# Patient Record
Sex: Female | Born: 1985 | Race: White | Hispanic: No | Marital: Single | Smoking: Never smoker
Health system: Southern US, Community
[De-identification: ages and names within clinical notes are randomized; demographics above are authoritative.]

## PROBLEM LIST (undated history)

## (undated) DIAGNOSIS — F419 Anxiety disorder, unspecified: Secondary | ICD-10-CM

## (undated) DIAGNOSIS — M797 Fibromyalgia: Secondary | ICD-10-CM

## (undated) HISTORY — DX: Anxiety disorder, unspecified: F41.9

## (undated) HISTORY — DX: Fibromyalgia: M79.7

## (undated) HISTORY — PX: KNEE ARTHROSCOPY: SHX127

---

## 2009-05-30 ENCOUNTER — Emergency Department (HOSPITAL_COMMUNITY): Admission: EM | Admit: 2009-05-30 | Discharge: 2009-05-30 | Payer: Self-pay | Admitting: Emergency Medicine

## 2010-03-16 ENCOUNTER — Encounter: Admission: RE | Admit: 2010-03-16 | Discharge: 2010-03-16 | Payer: Self-pay | Admitting: Emergency Medicine

## 2010-10-16 ENCOUNTER — Ambulatory Visit: Payer: BC Managed Care – PPO | Attending: Family Medicine | Admitting: Physical Therapy

## 2010-10-16 DIAGNOSIS — M25559 Pain in unspecified hip: Secondary | ICD-10-CM | POA: Insufficient documentation

## 2010-10-16 DIAGNOSIS — M6281 Muscle weakness (generalized): Secondary | ICD-10-CM | POA: Insufficient documentation

## 2010-10-16 DIAGNOSIS — R293 Abnormal posture: Secondary | ICD-10-CM | POA: Insufficient documentation

## 2010-10-16 DIAGNOSIS — IMO0001 Reserved for inherently not codable concepts without codable children: Secondary | ICD-10-CM | POA: Insufficient documentation

## 2010-10-23 ENCOUNTER — Ambulatory Visit: Payer: BC Managed Care – PPO | Admitting: Physical Therapy

## 2010-10-25 ENCOUNTER — Ambulatory Visit: Payer: BC Managed Care – PPO | Attending: Family Medicine | Admitting: Physical Therapy

## 2010-10-25 DIAGNOSIS — IMO0001 Reserved for inherently not codable concepts without codable children: Secondary | ICD-10-CM | POA: Insufficient documentation

## 2010-10-25 DIAGNOSIS — M6281 Muscle weakness (generalized): Secondary | ICD-10-CM | POA: Insufficient documentation

## 2010-10-25 DIAGNOSIS — M25559 Pain in unspecified hip: Secondary | ICD-10-CM | POA: Insufficient documentation

## 2010-10-25 DIAGNOSIS — R293 Abnormal posture: Secondary | ICD-10-CM | POA: Insufficient documentation

## 2010-10-29 ENCOUNTER — Ambulatory Visit: Payer: BC Managed Care – PPO

## 2010-11-01 ENCOUNTER — Ambulatory Visit: Payer: BC Managed Care – PPO | Admitting: Rehabilitation

## 2010-11-05 ENCOUNTER — Ambulatory Visit: Payer: BC Managed Care – PPO | Admitting: Rehabilitation

## 2010-11-08 ENCOUNTER — Ambulatory Visit: Payer: BC Managed Care – PPO | Admitting: Physical Therapy

## 2010-11-12 ENCOUNTER — Ambulatory Visit: Payer: BC Managed Care – PPO

## 2010-11-15 ENCOUNTER — Encounter: Payer: BC Managed Care – PPO | Admitting: Physical Therapy

## 2010-11-20 ENCOUNTER — Ambulatory Visit: Payer: BC Managed Care – PPO | Admitting: Physical Therapy

## 2010-11-22 ENCOUNTER — Ambulatory Visit: Payer: BC Managed Care – PPO | Admitting: Physical Therapy

## 2010-11-26 ENCOUNTER — Ambulatory Visit: Payer: BC Managed Care – PPO | Attending: Family Medicine | Admitting: Physical Therapy

## 2010-11-26 DIAGNOSIS — M6281 Muscle weakness (generalized): Secondary | ICD-10-CM | POA: Insufficient documentation

## 2010-11-26 DIAGNOSIS — IMO0001 Reserved for inherently not codable concepts without codable children: Secondary | ICD-10-CM | POA: Insufficient documentation

## 2010-11-26 DIAGNOSIS — M25559 Pain in unspecified hip: Secondary | ICD-10-CM | POA: Insufficient documentation

## 2010-11-26 DIAGNOSIS — R293 Abnormal posture: Secondary | ICD-10-CM | POA: Insufficient documentation

## 2010-11-28 ENCOUNTER — Ambulatory Visit: Payer: BC Managed Care – PPO | Admitting: Physical Therapy

## 2010-12-04 ENCOUNTER — Ambulatory Visit: Payer: BC Managed Care – PPO | Admitting: Physical Therapy

## 2010-12-10 ENCOUNTER — Ambulatory Visit: Payer: BC Managed Care – PPO | Admitting: Physical Therapy

## 2010-12-12 ENCOUNTER — Ambulatory Visit: Payer: BC Managed Care – PPO | Admitting: Physical Therapy

## 2010-12-17 ENCOUNTER — Ambulatory Visit: Payer: BC Managed Care – PPO | Admitting: Physical Therapy

## 2010-12-19 ENCOUNTER — Ambulatory Visit: Payer: BC Managed Care – PPO | Admitting: Rehabilitation

## 2010-12-24 ENCOUNTER — Ambulatory Visit: Payer: BC Managed Care – PPO | Admitting: Physical Therapy

## 2010-12-26 ENCOUNTER — Ambulatory Visit: Payer: BC Managed Care – PPO | Attending: Family Medicine | Admitting: Physical Therapy

## 2010-12-26 DIAGNOSIS — M6281 Muscle weakness (generalized): Secondary | ICD-10-CM | POA: Insufficient documentation

## 2010-12-26 DIAGNOSIS — M25559 Pain in unspecified hip: Secondary | ICD-10-CM | POA: Insufficient documentation

## 2010-12-26 DIAGNOSIS — IMO0001 Reserved for inherently not codable concepts without codable children: Secondary | ICD-10-CM | POA: Insufficient documentation

## 2010-12-26 DIAGNOSIS — R293 Abnormal posture: Secondary | ICD-10-CM | POA: Insufficient documentation

## 2010-12-31 ENCOUNTER — Ambulatory Visit: Payer: BC Managed Care – PPO | Admitting: Physical Therapy

## 2011-01-03 ENCOUNTER — Ambulatory Visit: Payer: BC Managed Care – PPO | Admitting: Rehabilitation

## 2011-01-07 ENCOUNTER — Ambulatory Visit: Payer: BC Managed Care – PPO | Admitting: Physical Therapy

## 2011-01-09 ENCOUNTER — Ambulatory Visit: Payer: BC Managed Care – PPO | Admitting: Physical Therapy

## 2011-01-15 ENCOUNTER — Ambulatory Visit: Payer: BC Managed Care – PPO | Admitting: Physical Therapy

## 2011-01-17 ENCOUNTER — Ambulatory Visit: Payer: BC Managed Care – PPO | Admitting: Rehabilitation

## 2011-01-22 ENCOUNTER — Ambulatory Visit: Payer: BC Managed Care – PPO | Admitting: Physical Therapy

## 2011-01-24 ENCOUNTER — Ambulatory Visit: Payer: BC Managed Care – PPO | Admitting: Physical Therapy

## 2011-01-28 ENCOUNTER — Encounter: Payer: BC Managed Care – PPO | Admitting: Physical Therapy

## 2011-01-30 ENCOUNTER — Ambulatory Visit: Payer: BC Managed Care – PPO | Attending: Family Medicine | Admitting: Physical Therapy

## 2011-01-30 DIAGNOSIS — R293 Abnormal posture: Secondary | ICD-10-CM | POA: Insufficient documentation

## 2011-01-30 DIAGNOSIS — IMO0001 Reserved for inherently not codable concepts without codable children: Secondary | ICD-10-CM | POA: Insufficient documentation

## 2011-01-30 DIAGNOSIS — M25559 Pain in unspecified hip: Secondary | ICD-10-CM | POA: Insufficient documentation

## 2011-01-30 DIAGNOSIS — M6281 Muscle weakness (generalized): Secondary | ICD-10-CM | POA: Insufficient documentation

## 2011-02-04 ENCOUNTER — Encounter: Payer: BC Managed Care – PPO | Admitting: Physical Therapy

## 2011-02-06 ENCOUNTER — Ambulatory Visit: Payer: BC Managed Care – PPO | Admitting: Physical Therapy

## 2011-02-18 ENCOUNTER — Ambulatory Visit: Payer: BC Managed Care – PPO | Admitting: Physical Therapy

## 2011-02-19 ENCOUNTER — Ambulatory Visit: Payer: BC Managed Care – PPO | Admitting: Physical Therapy

## 2011-02-20 ENCOUNTER — Ambulatory Visit: Payer: BC Managed Care – PPO | Admitting: Physical Therapy

## 2011-02-25 ENCOUNTER — Ambulatory Visit: Payer: BC Managed Care – PPO | Attending: Family Medicine | Admitting: Rehabilitation

## 2011-02-25 DIAGNOSIS — M25559 Pain in unspecified hip: Secondary | ICD-10-CM | POA: Insufficient documentation

## 2011-02-25 DIAGNOSIS — IMO0001 Reserved for inherently not codable concepts without codable children: Secondary | ICD-10-CM | POA: Insufficient documentation

## 2011-02-25 DIAGNOSIS — M6281 Muscle weakness (generalized): Secondary | ICD-10-CM | POA: Insufficient documentation

## 2011-02-25 DIAGNOSIS — R293 Abnormal posture: Secondary | ICD-10-CM | POA: Insufficient documentation

## 2011-03-04 ENCOUNTER — Ambulatory Visit: Payer: BC Managed Care – PPO | Admitting: Rehabilitation

## 2011-03-18 ENCOUNTER — Ambulatory Visit: Payer: BC Managed Care – PPO | Admitting: Physical Therapy

## 2011-03-20 ENCOUNTER — Ambulatory Visit: Payer: BC Managed Care – PPO | Admitting: Physical Therapy

## 2011-03-25 ENCOUNTER — Ambulatory Visit: Payer: BC Managed Care – PPO | Admitting: Physical Therapy

## 2011-03-27 ENCOUNTER — Ambulatory Visit: Payer: BC Managed Care – PPO | Attending: Family Medicine | Admitting: Physical Therapy

## 2011-03-27 DIAGNOSIS — R293 Abnormal posture: Secondary | ICD-10-CM | POA: Insufficient documentation

## 2011-03-27 DIAGNOSIS — M6281 Muscle weakness (generalized): Secondary | ICD-10-CM | POA: Insufficient documentation

## 2011-03-27 DIAGNOSIS — IMO0001 Reserved for inherently not codable concepts without codable children: Secondary | ICD-10-CM | POA: Insufficient documentation

## 2011-03-27 DIAGNOSIS — M25559 Pain in unspecified hip: Secondary | ICD-10-CM | POA: Insufficient documentation

## 2011-04-01 ENCOUNTER — Ambulatory Visit: Payer: BC Managed Care – PPO | Admitting: Physical Therapy

## 2011-04-04 ENCOUNTER — Encounter: Payer: BC Managed Care – PPO | Admitting: Physical Therapy

## 2011-04-08 ENCOUNTER — Ambulatory Visit: Payer: BC Managed Care – PPO | Attending: Sports Medicine | Admitting: Physical Therapy

## 2011-04-08 DIAGNOSIS — IMO0001 Reserved for inherently not codable concepts without codable children: Secondary | ICD-10-CM | POA: Insufficient documentation

## 2011-04-08 DIAGNOSIS — M25569 Pain in unspecified knee: Secondary | ICD-10-CM | POA: Insufficient documentation

## 2011-04-08 DIAGNOSIS — R262 Difficulty in walking, not elsewhere classified: Secondary | ICD-10-CM | POA: Insufficient documentation

## 2011-04-08 DIAGNOSIS — M25669 Stiffness of unspecified knee, not elsewhere classified: Secondary | ICD-10-CM | POA: Insufficient documentation

## 2011-04-11 ENCOUNTER — Ambulatory Visit: Payer: BC Managed Care – PPO | Admitting: Physical Therapy

## 2011-04-16 ENCOUNTER — Ambulatory Visit: Payer: BC Managed Care – PPO | Admitting: Physical Therapy

## 2011-04-17 ENCOUNTER — Ambulatory Visit: Payer: BC Managed Care – PPO | Admitting: Rehabilitation

## 2011-04-22 ENCOUNTER — Ambulatory Visit: Payer: BC Managed Care – PPO | Admitting: Rehabilitation

## 2011-04-24 ENCOUNTER — Ambulatory Visit: Payer: BC Managed Care – PPO | Admitting: Rehabilitation

## 2011-04-30 ENCOUNTER — Ambulatory Visit: Payer: BC Managed Care – PPO | Attending: Sports Medicine | Admitting: Rehabilitation

## 2011-04-30 DIAGNOSIS — R262 Difficulty in walking, not elsewhere classified: Secondary | ICD-10-CM | POA: Insufficient documentation

## 2011-04-30 DIAGNOSIS — IMO0001 Reserved for inherently not codable concepts without codable children: Secondary | ICD-10-CM | POA: Insufficient documentation

## 2011-04-30 DIAGNOSIS — M25669 Stiffness of unspecified knee, not elsewhere classified: Secondary | ICD-10-CM | POA: Insufficient documentation

## 2011-04-30 DIAGNOSIS — M25569 Pain in unspecified knee: Secondary | ICD-10-CM | POA: Insufficient documentation

## 2011-05-02 ENCOUNTER — Ambulatory Visit: Payer: BC Managed Care – PPO | Admitting: Physical Therapy

## 2011-05-06 ENCOUNTER — Ambulatory Visit: Payer: BC Managed Care – PPO | Admitting: Physical Therapy

## 2011-05-08 ENCOUNTER — Ambulatory Visit: Payer: BC Managed Care – PPO | Admitting: Rehabilitation

## 2011-05-15 ENCOUNTER — Ambulatory Visit: Payer: BC Managed Care – PPO | Admitting: Physical Therapy

## 2011-05-22 ENCOUNTER — Ambulatory Visit: Payer: BC Managed Care – PPO | Admitting: Physical Therapy

## 2011-05-23 ENCOUNTER — Ambulatory Visit: Payer: BC Managed Care – PPO | Admitting: Physical Therapy

## 2011-05-29 ENCOUNTER — Ambulatory Visit: Payer: BC Managed Care – PPO | Attending: Family Medicine | Admitting: Physical Therapy

## 2011-05-29 DIAGNOSIS — M25559 Pain in unspecified hip: Secondary | ICD-10-CM | POA: Insufficient documentation

## 2011-05-29 DIAGNOSIS — R293 Abnormal posture: Secondary | ICD-10-CM | POA: Insufficient documentation

## 2011-05-29 DIAGNOSIS — M6281 Muscle weakness (generalized): Secondary | ICD-10-CM | POA: Insufficient documentation

## 2011-05-29 DIAGNOSIS — IMO0001 Reserved for inherently not codable concepts without codable children: Secondary | ICD-10-CM | POA: Insufficient documentation

## 2011-05-30 ENCOUNTER — Ambulatory Visit: Payer: BC Managed Care – PPO | Admitting: Physical Therapy

## 2011-06-03 ENCOUNTER — Ambulatory Visit: Payer: BC Managed Care – PPO | Admitting: Physical Therapy

## 2011-06-05 ENCOUNTER — Ambulatory Visit: Payer: BC Managed Care – PPO | Admitting: Physical Therapy

## 2011-06-10 ENCOUNTER — Ambulatory Visit: Payer: BC Managed Care – PPO | Admitting: Physical Therapy

## 2011-06-12 ENCOUNTER — Ambulatory Visit: Payer: BC Managed Care – PPO | Admitting: Physical Therapy

## 2011-06-17 ENCOUNTER — Ambulatory Visit: Payer: BC Managed Care – PPO | Admitting: Physical Therapy

## 2011-06-19 ENCOUNTER — Ambulatory Visit: Payer: BC Managed Care – PPO | Admitting: Physical Therapy

## 2011-06-24 ENCOUNTER — Ambulatory Visit: Payer: BC Managed Care – PPO | Admitting: Physical Therapy

## 2011-06-26 ENCOUNTER — Ambulatory Visit: Payer: BC Managed Care – PPO | Admitting: Physical Therapy

## 2011-07-04 ENCOUNTER — Ambulatory Visit: Payer: BC Managed Care – PPO | Attending: Family Medicine | Admitting: Physical Therapy

## 2011-07-04 DIAGNOSIS — R293 Abnormal posture: Secondary | ICD-10-CM | POA: Insufficient documentation

## 2011-07-04 DIAGNOSIS — M6281 Muscle weakness (generalized): Secondary | ICD-10-CM | POA: Insufficient documentation

## 2011-07-04 DIAGNOSIS — IMO0001 Reserved for inherently not codable concepts without codable children: Secondary | ICD-10-CM | POA: Insufficient documentation

## 2011-07-04 DIAGNOSIS — M25559 Pain in unspecified hip: Secondary | ICD-10-CM | POA: Insufficient documentation

## 2011-07-08 ENCOUNTER — Ambulatory Visit: Payer: BC Managed Care – PPO | Admitting: Physical Therapy

## 2011-07-10 ENCOUNTER — Encounter: Payer: BC Managed Care – PPO | Admitting: Physical Therapy

## 2011-07-15 ENCOUNTER — Encounter: Payer: BC Managed Care – PPO | Admitting: Physical Therapy

## 2011-07-17 ENCOUNTER — Encounter: Payer: BC Managed Care – PPO | Admitting: Physical Therapy

## 2011-07-22 ENCOUNTER — Ambulatory Visit: Payer: BC Managed Care – PPO | Admitting: Physical Therapy

## 2011-07-22 ENCOUNTER — Encounter: Payer: BC Managed Care – PPO | Admitting: Physical Therapy

## 2011-07-23 ENCOUNTER — Ambulatory Visit: Payer: BC Managed Care – PPO | Admitting: Physical Therapy

## 2011-07-24 ENCOUNTER — Encounter: Payer: BC Managed Care – PPO | Admitting: Physical Therapy

## 2011-07-29 ENCOUNTER — Ambulatory Visit: Payer: BC Managed Care – PPO | Attending: Family Medicine | Admitting: Physical Therapy

## 2011-07-29 DIAGNOSIS — M25559 Pain in unspecified hip: Secondary | ICD-10-CM | POA: Insufficient documentation

## 2011-07-29 DIAGNOSIS — IMO0001 Reserved for inherently not codable concepts without codable children: Secondary | ICD-10-CM | POA: Insufficient documentation

## 2011-07-29 DIAGNOSIS — R293 Abnormal posture: Secondary | ICD-10-CM | POA: Insufficient documentation

## 2011-07-29 DIAGNOSIS — M6281 Muscle weakness (generalized): Secondary | ICD-10-CM | POA: Insufficient documentation

## 2011-07-31 ENCOUNTER — Ambulatory Visit: Payer: BC Managed Care – PPO | Admitting: Physical Therapy

## 2011-08-05 ENCOUNTER — Ambulatory Visit: Payer: BC Managed Care – PPO | Admitting: Physical Therapy

## 2011-08-08 ENCOUNTER — Encounter: Payer: BC Managed Care – PPO | Admitting: Physical Therapy

## 2011-08-12 ENCOUNTER — Ambulatory Visit: Payer: BC Managed Care – PPO | Admitting: Physical Therapy

## 2011-08-14 ENCOUNTER — Ambulatory Visit: Payer: BC Managed Care – PPO | Admitting: Physical Therapy

## 2011-08-29 ENCOUNTER — Ambulatory Visit: Payer: BC Managed Care – PPO | Admitting: Physical Therapy

## 2011-09-02 ENCOUNTER — Encounter: Payer: BC Managed Care – PPO | Admitting: Physical Therapy

## 2011-09-03 ENCOUNTER — Ambulatory Visit: Payer: BC Managed Care – PPO | Attending: Family Medicine | Admitting: Physical Therapy

## 2011-09-03 DIAGNOSIS — M6281 Muscle weakness (generalized): Secondary | ICD-10-CM | POA: Insufficient documentation

## 2011-09-03 DIAGNOSIS — IMO0001 Reserved for inherently not codable concepts without codable children: Secondary | ICD-10-CM | POA: Insufficient documentation

## 2011-09-03 DIAGNOSIS — R293 Abnormal posture: Secondary | ICD-10-CM | POA: Insufficient documentation

## 2011-09-03 DIAGNOSIS — M25559 Pain in unspecified hip: Secondary | ICD-10-CM | POA: Insufficient documentation

## 2011-09-04 ENCOUNTER — Ambulatory Visit: Payer: BC Managed Care – PPO | Admitting: Physical Therapy

## 2011-09-09 ENCOUNTER — Ambulatory Visit: Payer: BC Managed Care – PPO | Admitting: Physical Therapy

## 2011-09-11 ENCOUNTER — Encounter: Payer: BC Managed Care – PPO | Admitting: Physical Therapy

## 2011-09-16 ENCOUNTER — Encounter: Payer: BC Managed Care – PPO | Admitting: Physical Therapy

## 2011-09-18 ENCOUNTER — Encounter: Payer: BC Managed Care – PPO | Admitting: Physical Therapy

## 2011-09-23 ENCOUNTER — Encounter: Payer: BC Managed Care – PPO | Admitting: Physical Therapy

## 2011-09-24 ENCOUNTER — Ambulatory Visit: Payer: BC Managed Care – PPO | Attending: Internal Medicine | Admitting: Physical Therapy

## 2011-09-24 DIAGNOSIS — M2550 Pain in unspecified joint: Secondary | ICD-10-CM | POA: Insufficient documentation

## 2011-09-24 DIAGNOSIS — IMO0001 Reserved for inherently not codable concepts without codable children: Secondary | ICD-10-CM | POA: Insufficient documentation

## 2011-09-25 ENCOUNTER — Encounter: Payer: BC Managed Care – PPO | Admitting: Physical Therapy

## 2011-09-25 ENCOUNTER — Encounter: Payer: BC Managed Care – PPO | Admitting: Rehabilitation

## 2011-09-30 ENCOUNTER — Encounter: Payer: BC Managed Care – PPO | Admitting: Rehabilitation

## 2011-09-30 ENCOUNTER — Encounter: Payer: BC Managed Care – PPO | Admitting: Physical Therapy

## 2011-10-01 ENCOUNTER — Ambulatory Visit: Payer: BC Managed Care – PPO | Attending: Family Medicine | Admitting: Rehabilitation

## 2011-10-01 DIAGNOSIS — R293 Abnormal posture: Secondary | ICD-10-CM | POA: Insufficient documentation

## 2011-10-01 DIAGNOSIS — M25559 Pain in unspecified hip: Secondary | ICD-10-CM | POA: Insufficient documentation

## 2011-10-01 DIAGNOSIS — M6281 Muscle weakness (generalized): Secondary | ICD-10-CM | POA: Insufficient documentation

## 2011-10-01 DIAGNOSIS — IMO0001 Reserved for inherently not codable concepts without codable children: Secondary | ICD-10-CM | POA: Insufficient documentation

## 2011-10-02 ENCOUNTER — Ambulatory Visit: Payer: BC Managed Care – PPO | Admitting: Rehabilitation

## 2011-10-07 ENCOUNTER — Ambulatory Visit: Payer: BC Managed Care – PPO | Admitting: Rehabilitation

## 2011-10-09 ENCOUNTER — Ambulatory Visit: Payer: BC Managed Care – PPO | Admitting: Rehabilitation

## 2011-10-14 ENCOUNTER — Ambulatory Visit: Payer: BC Managed Care – PPO | Admitting: Physical Therapy

## 2011-10-16 ENCOUNTER — Ambulatory Visit: Payer: BC Managed Care – PPO | Admitting: Rehabilitation

## 2011-10-21 ENCOUNTER — Ambulatory Visit: Payer: BC Managed Care – PPO | Admitting: Rehabilitation

## 2011-10-22 ENCOUNTER — Encounter: Payer: BC Managed Care – PPO | Admitting: Physical Therapy

## 2011-10-23 ENCOUNTER — Ambulatory Visit: Payer: BC Managed Care – PPO | Admitting: Rehabilitation

## 2011-10-28 ENCOUNTER — Encounter: Payer: BC Managed Care – PPO | Admitting: Physical Therapy

## 2011-10-30 ENCOUNTER — Ambulatory Visit: Payer: BC Managed Care – PPO | Attending: Family Medicine | Admitting: Rehabilitation

## 2011-10-30 DIAGNOSIS — R293 Abnormal posture: Secondary | ICD-10-CM | POA: Insufficient documentation

## 2011-10-30 DIAGNOSIS — IMO0001 Reserved for inherently not codable concepts without codable children: Secondary | ICD-10-CM | POA: Insufficient documentation

## 2011-10-30 DIAGNOSIS — M6281 Muscle weakness (generalized): Secondary | ICD-10-CM | POA: Insufficient documentation

## 2011-10-30 DIAGNOSIS — M25559 Pain in unspecified hip: Secondary | ICD-10-CM | POA: Insufficient documentation

## 2011-11-04 ENCOUNTER — Ambulatory Visit: Payer: BC Managed Care – PPO | Admitting: Rehabilitation

## 2011-11-06 ENCOUNTER — Ambulatory Visit: Payer: BC Managed Care – PPO | Admitting: Physical Therapy

## 2011-11-12 ENCOUNTER — Ambulatory Visit: Payer: BC Managed Care – PPO | Admitting: Physical Therapy

## 2011-11-14 ENCOUNTER — Ambulatory Visit: Payer: BC Managed Care – PPO | Admitting: Rehabilitation

## 2011-11-18 ENCOUNTER — Ambulatory Visit: Payer: BC Managed Care – PPO | Admitting: Rehabilitation

## 2011-11-20 ENCOUNTER — Ambulatory Visit: Payer: BC Managed Care – PPO | Admitting: Rehabilitation

## 2011-12-02 ENCOUNTER — Ambulatory Visit: Payer: BC Managed Care – PPO | Attending: Family Medicine | Admitting: Physical Therapy

## 2011-12-02 DIAGNOSIS — R293 Abnormal posture: Secondary | ICD-10-CM | POA: Insufficient documentation

## 2011-12-02 DIAGNOSIS — M25559 Pain in unspecified hip: Secondary | ICD-10-CM | POA: Insufficient documentation

## 2011-12-02 DIAGNOSIS — M6281 Muscle weakness (generalized): Secondary | ICD-10-CM | POA: Insufficient documentation

## 2011-12-02 DIAGNOSIS — IMO0001 Reserved for inherently not codable concepts without codable children: Secondary | ICD-10-CM | POA: Insufficient documentation

## 2011-12-05 ENCOUNTER — Ambulatory Visit: Payer: BC Managed Care – PPO | Admitting: Rehabilitation

## 2011-12-10 ENCOUNTER — Ambulatory Visit: Payer: BC Managed Care – PPO | Admitting: Physical Therapy

## 2011-12-12 ENCOUNTER — Ambulatory Visit: Payer: BC Managed Care – PPO | Admitting: Rehabilitation

## 2011-12-17 ENCOUNTER — Ambulatory Visit: Payer: BC Managed Care – PPO | Admitting: Physical Therapy

## 2011-12-19 ENCOUNTER — Ambulatory Visit: Payer: BC Managed Care – PPO | Admitting: Rehabilitation

## 2011-12-25 ENCOUNTER — Ambulatory Visit: Payer: BC Managed Care – PPO | Attending: Family Medicine | Admitting: Rehabilitation

## 2011-12-25 DIAGNOSIS — M25559 Pain in unspecified hip: Secondary | ICD-10-CM | POA: Insufficient documentation

## 2011-12-25 DIAGNOSIS — M6281 Muscle weakness (generalized): Secondary | ICD-10-CM | POA: Insufficient documentation

## 2011-12-25 DIAGNOSIS — IMO0001 Reserved for inherently not codable concepts without codable children: Secondary | ICD-10-CM | POA: Insufficient documentation

## 2011-12-25 DIAGNOSIS — R293 Abnormal posture: Secondary | ICD-10-CM | POA: Insufficient documentation

## 2011-12-26 ENCOUNTER — Ambulatory Visit: Payer: BC Managed Care – PPO | Admitting: Rehabilitation

## 2011-12-30 ENCOUNTER — Ambulatory Visit: Payer: BC Managed Care – PPO | Admitting: Physical Therapy

## 2012-01-01 ENCOUNTER — Ambulatory Visit: Payer: BC Managed Care – PPO | Admitting: Rehabilitation

## 2012-01-06 ENCOUNTER — Encounter: Payer: BC Managed Care – PPO | Admitting: Physical Therapy

## 2012-01-08 ENCOUNTER — Ambulatory Visit: Payer: BC Managed Care – PPO | Admitting: Rehabilitation

## 2012-01-13 ENCOUNTER — Ambulatory Visit: Payer: BC Managed Care – PPO | Admitting: Rehabilitation

## 2012-01-15 ENCOUNTER — Ambulatory Visit: Payer: BC Managed Care – PPO | Admitting: Rehabilitation

## 2012-01-21 ENCOUNTER — Ambulatory Visit: Payer: BC Managed Care – PPO | Admitting: Rehabilitation

## 2012-01-23 ENCOUNTER — Ambulatory Visit: Payer: BC Managed Care – PPO | Admitting: Physical Therapy

## 2012-01-28 ENCOUNTER — Ambulatory Visit: Payer: BC Managed Care – PPO | Attending: Family Medicine | Admitting: Rehabilitation

## 2012-01-28 DIAGNOSIS — R293 Abnormal posture: Secondary | ICD-10-CM | POA: Insufficient documentation

## 2012-01-28 DIAGNOSIS — M6281 Muscle weakness (generalized): Secondary | ICD-10-CM | POA: Insufficient documentation

## 2012-01-28 DIAGNOSIS — M25559 Pain in unspecified hip: Secondary | ICD-10-CM | POA: Insufficient documentation

## 2012-01-28 DIAGNOSIS — IMO0001 Reserved for inherently not codable concepts without codable children: Secondary | ICD-10-CM | POA: Insufficient documentation

## 2012-01-30 ENCOUNTER — Ambulatory Visit: Payer: BC Managed Care – PPO | Admitting: Physical Therapy

## 2012-02-03 ENCOUNTER — Ambulatory Visit: Payer: BC Managed Care – PPO | Admitting: Rehabilitation

## 2012-02-05 ENCOUNTER — Ambulatory Visit: Payer: BC Managed Care – PPO | Admitting: Rehabilitation

## 2012-02-10 ENCOUNTER — Ambulatory Visit: Payer: BC Managed Care – PPO | Admitting: Physical Therapy

## 2012-02-12 ENCOUNTER — Ambulatory Visit: Payer: BC Managed Care – PPO | Admitting: Physical Therapy

## 2012-02-17 ENCOUNTER — Ambulatory Visit: Payer: BC Managed Care – PPO | Admitting: Rehabilitation

## 2012-02-19 ENCOUNTER — Ambulatory Visit: Payer: BC Managed Care – PPO | Admitting: Rehabilitation

## 2012-02-24 ENCOUNTER — Ambulatory Visit: Payer: BC Managed Care – PPO | Attending: Family Medicine

## 2012-02-24 DIAGNOSIS — M25559 Pain in unspecified hip: Secondary | ICD-10-CM | POA: Insufficient documentation

## 2012-02-24 DIAGNOSIS — M6281 Muscle weakness (generalized): Secondary | ICD-10-CM | POA: Insufficient documentation

## 2012-02-24 DIAGNOSIS — R293 Abnormal posture: Secondary | ICD-10-CM | POA: Insufficient documentation

## 2012-02-24 DIAGNOSIS — IMO0001 Reserved for inherently not codable concepts without codable children: Secondary | ICD-10-CM | POA: Insufficient documentation

## 2012-03-02 ENCOUNTER — Ambulatory Visit: Payer: BC Managed Care – PPO | Admitting: Rehabilitation

## 2012-03-04 ENCOUNTER — Ambulatory Visit: Payer: BC Managed Care – PPO | Admitting: Rehabilitation

## 2012-03-05 ENCOUNTER — Encounter: Payer: BC Managed Care – PPO | Admitting: Physical Therapy

## 2012-03-09 ENCOUNTER — Ambulatory Visit: Payer: BC Managed Care – PPO | Admitting: Rehabilitation

## 2012-03-11 ENCOUNTER — Ambulatory Visit: Payer: BC Managed Care – PPO | Admitting: Physical Therapy

## 2012-03-23 ENCOUNTER — Ambulatory Visit: Payer: BC Managed Care – PPO | Admitting: Rehabilitation

## 2012-03-25 ENCOUNTER — Ambulatory Visit: Payer: BC Managed Care – PPO | Admitting: Rehabilitation

## 2012-03-31 ENCOUNTER — Ambulatory Visit: Payer: BC Managed Care – PPO | Attending: Family Medicine | Admitting: Physical Therapy

## 2012-03-31 DIAGNOSIS — IMO0001 Reserved for inherently not codable concepts without codable children: Secondary | ICD-10-CM | POA: Insufficient documentation

## 2012-03-31 DIAGNOSIS — R293 Abnormal posture: Secondary | ICD-10-CM | POA: Insufficient documentation

## 2012-03-31 DIAGNOSIS — M6281 Muscle weakness (generalized): Secondary | ICD-10-CM | POA: Insufficient documentation

## 2012-03-31 DIAGNOSIS — M25559 Pain in unspecified hip: Secondary | ICD-10-CM | POA: Insufficient documentation

## 2012-04-02 ENCOUNTER — Ambulatory Visit: Payer: BC Managed Care – PPO | Admitting: Physical Therapy

## 2012-04-07 ENCOUNTER — Ambulatory Visit: Payer: BC Managed Care – PPO | Admitting: Physical Therapy

## 2012-04-09 ENCOUNTER — Encounter: Payer: BC Managed Care – PPO | Admitting: Physical Therapy

## 2012-04-14 ENCOUNTER — Encounter: Payer: BC Managed Care – PPO | Admitting: Rehabilitation

## 2012-04-16 ENCOUNTER — Encounter: Payer: BC Managed Care – PPO | Admitting: Rehabilitation

## 2012-04-21 ENCOUNTER — Encounter: Payer: BC Managed Care – PPO | Admitting: Rehabilitation

## 2012-04-23 ENCOUNTER — Encounter: Payer: BC Managed Care – PPO | Admitting: Rehabilitation

## 2012-04-28 ENCOUNTER — Encounter: Payer: BC Managed Care – PPO | Admitting: Physical Therapy

## 2012-04-30 ENCOUNTER — Encounter: Payer: BC Managed Care – PPO | Admitting: Rehabilitation

## 2012-05-04 ENCOUNTER — Encounter: Payer: BC Managed Care – PPO | Admitting: Rehabilitation

## 2012-05-06 ENCOUNTER — Encounter: Payer: BC Managed Care – PPO | Admitting: Physical Therapy

## 2012-05-11 ENCOUNTER — Encounter: Payer: BC Managed Care – PPO | Admitting: Physical Therapy

## 2012-05-13 ENCOUNTER — Encounter: Payer: BC Managed Care – PPO | Admitting: Physical Therapy

## 2012-05-18 ENCOUNTER — Encounter: Payer: BC Managed Care – PPO | Admitting: Rehabilitation

## 2012-05-20 ENCOUNTER — Encounter: Payer: BC Managed Care – PPO | Admitting: Physical Therapy

## 2012-12-11 ENCOUNTER — Ambulatory Visit: Payer: BC Managed Care – PPO

## 2012-12-11 ENCOUNTER — Ambulatory Visit (INDEPENDENT_AMBULATORY_CARE_PROVIDER_SITE_OTHER): Payer: BC Managed Care – PPO | Admitting: Family Medicine

## 2012-12-11 VITALS — BP 129/84 | HR 90 | Temp 98.1°F | Resp 16 | Ht 66.0 in | Wt 210.0 lb

## 2012-12-11 DIAGNOSIS — M25572 Pain in left ankle and joints of left foot: Secondary | ICD-10-CM

## 2012-12-11 DIAGNOSIS — M25579 Pain in unspecified ankle and joints of unspecified foot: Secondary | ICD-10-CM

## 2012-12-11 NOTE — Progress Notes (Signed)
[facility] [address] [place] [address] [phone]  Date:  12/11/2012   Name:  Jo Singh   DOB:  [birth year]   MRN:  [id number]  PCP:  No primary provider on file.    Chief Complaint: Foot Injury   History of Present Illness:  Jo Singh is a 27 y.o. very pleasant female patient who presents with the following:  She was doing some yard- work outside a couple of days ago.  She was wearing shoes, and stepped down wrong on a shovel and hurt the lateral side of her left foot.  She had immediate pain and it is still difficult to walk on the foot.    LMP a couple of weeks ago She is generally healthy except for FBM No prior history of significant foot injury or surgery  There is no problem list on file for this patient.   Past Medical History  Diagnosis Date  . Anxiety   . Asthma   . Fibromyalgia     Past Surgical History  Procedure Laterality Date  . Knee arthroscopy      left knee 2012    History  Substance Use Topics  . Smoking status: Never Smoker   . Smokeless tobacco: Not on file  . Alcohol Use: No    Family History  Problem Relation Age of Onset  . Cancer Maternal Grandmother     Allergies  Allergen Reactions  . Darvocet (Propoxyphene-Acetaminophen) Nausea And Vomiting  . Flexeril (Cyclobenzaprine)   . Meloxicam Nausea And Vomiting    Medication list has been reviewed and updated.  No current outpatient prescriptions on file prior to visit.   No current facility-administered medications on file prior to visit.    Review of Systems:  As per HPI- otherwise negative.   Physical Examination: Filed Vitals:   12/11/12 1805  BP: 129/84  Pulse: 116  Temp: 98.1 F (36.7 C)  Resp: 16   Filed Vitals:   12/11/12 1805  Height: 5\' 6"  (1.676 m)  Weight: 210 lb (95.255 kg)   Body mass index is 33.91 kg/(m^2). Ideal Body Weight: Weight in (lb) to have BMI = 25: 154.6   GEN: WDWN, NAD, Non-toxic, Alert &  Oriented x 3, overweight HEENT: Atraumatic, Normocephalic.  Ears and Nose: No external deformity. EXTR: No clubbing/cyanosis/edema NEURO: favoring left foot.  PSYCH: Normally interactive. Conversant. Not depressed or anxious appearing.  Calm demeanor.  Left foot: tender and bruised along the lateral foot.  There is minimal swelling, no redness.  Toes are cold bilaterally- she reports a history of "poor circulation" to her feet.  However, she has good cap refill and normal sensation/ motion of the toes  [facility] reading (PRIMARY) by  Dr. Patsy Lager. Left foot: negative LEFT FOOT - COMPLETE 3+ VIEW  Comparison: None.  Findings: Anatomic alignment bones of the foot. Exam is somewhat under penetrated. Bipartite medial great toe sesamoid noted. There is no fracture identified. Soft tissues appear within normal limits. No radiopaque foreign body. Fifth metatarsal appears intact.  IMPRESSION: Negative.   Assessment and Plan: Pain in joint, ankle and foot, left - Plan: DG Foot Complete Left contusion to left foot.  No evidence of fracture. Given post- op shoe and crutches.    Signed Abbe Amsterdam, MD

## 2012-12-11 NOTE — Patient Instructions (Addendum)
Wear the post- op shoe and use your crutches as needed.  Ice, elevate and use ibuprofen as needed.  If your foot is not a good bit better in the next few days please let me know- Sooner if worse.

## 2013-07-07 ENCOUNTER — Emergency Department (HOSPITAL_BASED_OUTPATIENT_CLINIC_OR_DEPARTMENT_OTHER): Payer: Worker's Compensation

## 2013-07-07 ENCOUNTER — Encounter (HOSPITAL_BASED_OUTPATIENT_CLINIC_OR_DEPARTMENT_OTHER): Payer: Self-pay | Admitting: Emergency Medicine

## 2013-07-07 ENCOUNTER — Emergency Department (HOSPITAL_BASED_OUTPATIENT_CLINIC_OR_DEPARTMENT_OTHER)
Admission: EM | Admit: 2013-07-07 | Discharge: 2013-07-07 | Disposition: A | Discharge status: Home / Self Care | Payer: Worker's Compensation | Attending: Emergency Medicine | Admitting: Emergency Medicine

## 2013-07-07 DIAGNOSIS — J45909 Unspecified asthma, uncomplicated: Secondary | ICD-10-CM | POA: Insufficient documentation

## 2013-07-07 DIAGNOSIS — S9030XA Contusion of unspecified foot, initial encounter: Secondary | ICD-10-CM | POA: Insufficient documentation

## 2013-07-07 DIAGNOSIS — IMO0001 Reserved for inherently not codable concepts without codable children: Secondary | ICD-10-CM | POA: Insufficient documentation

## 2013-07-07 DIAGNOSIS — F411 Generalized anxiety disorder: Secondary | ICD-10-CM | POA: Insufficient documentation

## 2013-07-07 DIAGNOSIS — Z79899 Other long term (current) drug therapy: Secondary | ICD-10-CM | POA: Insufficient documentation

## 2013-07-07 DIAGNOSIS — W208XXA Other cause of strike by thrown, projected or falling object, initial encounter: Secondary | ICD-10-CM | POA: Insufficient documentation

## 2013-07-07 DIAGNOSIS — S9032XA Contusion of left foot, initial encounter: Secondary | ICD-10-CM

## 2013-07-07 DIAGNOSIS — Y939 Activity, unspecified: Secondary | ICD-10-CM | POA: Insufficient documentation

## 2013-07-07 DIAGNOSIS — Y9229 Other specified public building as the place of occurrence of the external cause: Secondary | ICD-10-CM | POA: Insufficient documentation

## 2013-07-07 DIAGNOSIS — Z791 Long term (current) use of non-steroidal anti-inflammatories (NSAID): Secondary | ICD-10-CM | POA: Insufficient documentation

## 2013-07-07 NOTE — ED Notes (Signed)
Pt c/o left foot injury x 3 hrs ago

## 2013-07-07 NOTE — ED Provider Notes (Signed)
CSN: [id number]     Arrival date & time 07/07/13  1514 History   First MD Initiated Contact with Patient 07/07/13 1515     Chief Complaint  Patient presents with  . Foot Injury   (Consider location/radiation/quality/duration/timing/severity/associated sxs/prior Treatment) Patient is a 27 y.o. female presenting with foot injury.  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 Complaining of moderate aching pain, worse with movement. Not improved with ice.   Past Medical History  Diagnosis Date  . Anxiety   . Asthma   . Fibromyalgia    Past Surgical History  Procedure Laterality Date  . Knee arthroscopy      left knee 2012   Family History  Problem Relation Age of Onset  . Cancer Maternal Grandmother    History  Substance Use Topics  . Smoking status: Never Smoker   . Smokeless tobacco: Not on file  . Alcohol Use: No   OB History   Grav Para Term Preterm Abortions TAB SAB Ect Mult Living                 Review of Systems All other systems reviewed and are negative except as noted in HPI.   Allergies  Darvocet; Flexeril; and Meloxicam  Home Medications   Current Outpatient Rx  Name  Route  Sig  Dispense  Refill  . albuterol (PROVENTIL HFA;VENTOLIN HFA) 108 (90 BASE) MCG/ACT inhaler   Inhalation   Inhale into the lungs every 6 (six) hours as needed for wheezing or shortness of breath.         . Norgestimate-Ethinyl Estradiol Triphasic (ORTHO TRI-CYCLEN LO) 0.18/0.215/0.25 MG-25 MCG tab   Oral   Take 1 tablet by mouth daily.         Marland Kitchen ALPRAZolam (XANAX) 1 MG tablet   Oral   Take 1 mg by mouth 3 (three) times daily as needed for sleep.         . Milnacipran (SAVELLA) 50 MG TABS   Oral   Take 50 mg by mouth 2 (two) times daily.         . naproxen (NAPROSYN) 250 MG tablet   Oral   Take 250 mg by mouth 2 (two) times daily with a meal.         . pregabalin  (LYRICA) 150 MG capsule   Oral   Take 150 mg by mouth 2 (two) times daily.          BP 139/89  Pulse 115  Temp(Src) 98.5 F (36.9 C) (Oral)  Resp 16  Ht 5\' 6"  (1.676 m)  Wt 209 lb (94.802 kg)  BMI 33.75 kg/m2  LMP 06/23/2013 Physical Exam  Constitutional: She is oriented to person, place, and time. She appears well-developed and well-nourished.  HENT:  Head: Normocephalic and atraumatic.  Neck: Neck supple.  Pulmonary/Chest: Effort normal.  Musculoskeletal: She exhibits tenderness.  Ecchymosis and tenderness on the L dorsal foot overlying the MTP joints, no malleolar tenderness  Neurological: She is alert and oriented to person, place, and time. No cranial nerve deficit.  Psychiatric: She has a normal mood and affect. Her behavior is normal.    ED Course  Procedures (including critical care time) Labs Review Labs Reviewed - No data to display Imaging Review Dg Foot Complete Left  07/07/2013   CLINICAL DATA:  Pain after blunt trauma.  EXAM: LEFT FOOT -  COMPLETE 3+ VIEW  COMPARISON:  12/11/2012  FINDINGS: There is no fracture or dislocation or other significant osseous abnormality. There is a bipartite medial sesamoid under the head of the 1st metatarsal. This is a normal variant and unchanged.  IMPRESSION: Negative exam.   Electronically Signed   By: Geanie Cooley M.D.   On: 07/07/2013 15:57    EKG Interpretation   None       MDM   1. Contusion of left foot, initial encounter     Xray neg. Will try ACE wrap and add Post-op shoe if still having significant discomfort. Pt wants to return to school tomorrow, worker's comp paperwork filled out. No restrictions. Advised RICE for the next 24-48hrs.     Charles B. Bernette Mayers, MD 07/07/13 [phone]

## 2013-12-01 DIAGNOSIS — M797 Fibromyalgia: Secondary | ICD-10-CM | POA: Insufficient documentation

## 2013-12-31 ENCOUNTER — Ambulatory Visit (INDEPENDENT_AMBULATORY_CARE_PROVIDER_SITE_OTHER): Payer: BC Managed Care – PPO | Admitting: Family Medicine

## 2013-12-31 ENCOUNTER — Ambulatory Visit: Payer: BC Managed Care – PPO

## 2013-12-31 VITALS — BP 122/84 | HR 88 | Temp 98.1°F | Resp 16 | Ht 65.0 in | Wt 212.2 lb

## 2013-12-31 DIAGNOSIS — M25519 Pain in unspecified shoulder: Secondary | ICD-10-CM

## 2013-12-31 MED ORDER — METHOCARBAMOL 500 MG PO TABS: 500.0000 mg | ORAL_TABLET | Freq: Three times a day (TID) | ORAL | Status: DC

## 2013-12-31 MED ORDER — HYDROCODONE-ACETAMINOPHEN 5-325 MG PO TABS: 1.0000 | ORAL_TABLET | Freq: Four times a day (QID) | ORAL | Status: DC | PRN

## 2013-12-31 NOTE — Progress Notes (Signed)
   Subjective:    Patient ID: Jo Singh, female    DOB: 10/28/1985, 28 y.o.   MRN: [id number]  HPI 28 year old female presents for evaluation of left shoulder pain x 4 days. Symptoms have significantly worsened over the past 3 days with increasing difficulty with ROM of her joint. No specific injury or trauma. Believed at first that she had slept on it wrong causing the pain.  Has pain and limited ROM. Describes a "heavy feeling" in her fingers but no paresthesias or weakness. Greatest amount of pain is with abduction and flexion.  She has been trying to do gentle stretching but today the pain was so severe she decided to come in for evaluation.    No hx of shoulder surgery or prior shoulder injuries.     PMHx significant for fibromyalgia. She takes lyrica and naprosyn daily and is doing well on this regimen.      Review of Systems  Musculoskeletal: Positive for arthralgias. Negative for joint swelling.  Skin: Negative for color change.  Neurological: Negative for dizziness, weakness, numbness and headaches.       Objective:   Physical Exam  Constitutional: She is oriented to person, place, and time. She appears well-developed and well-nourished.  HENT:  Head: Normocephalic and atraumatic.  Right Ear: External ear normal.  Left Ear: External ear normal.  Eyes: Conjunctivae are normal.  Neck: Normal range of motion.  Cardiovascular: Normal rate.   Pulmonary/Chest: Effort normal.  Musculoskeletal:       Left shoulder: She exhibits decreased range of motion, tenderness (superior border of scapula and acromion), bony tenderness, pain and spasm. She exhibits no swelling, no deformity, no laceration, normal pulse and normal strength.       Cervical back: Normal.  Neurological: She is alert and oriented to person, place, and time.  Psychiatric: She has a normal mood and affect. Her behavior is normal. Judgment and thought content normal.     [facility] reading (PRIMARY) by  Dr. Katrinka BlazingSmith  as no acute bony abnormality.        Assessment & Plan:  Pain in joint, shoulder region - Plan: DG Shoulder Left, methocarbamol (ROBAXIN) 500 MG tablet, HYDROcodone-acetaminophen (NORCO) 5-325 MG per tablet  Acute spasm/inflammation with associated early frozen shoulder Per patient she tolerates hydrocodone without any adverse affects Will treat with Robaxin 500 mg tid prn spasm Norco 5/325 mg q6hours prn pain Rest x 24 hours. Start gentle stretching and exercises tomorrow as tolerated.  Plan to return to work on 5/11 - ok to extend if needed. If pain does not improve over the next 5-7 days consider referral to Ortho for further evaluation and treatment.

## 2014-06-29 NOTE — Progress Notes (Signed)
History and physical examinations reviewed with Rhoderick MoodyHeather Marte, PA-C. Xray reviewed and normal. Agree with assessment and plan.

## 2015-05-30 DIAGNOSIS — F0781 Postconcussional syndrome: Secondary | ICD-10-CM | POA: Insufficient documentation

## 2015-05-30 DIAGNOSIS — S060X9A Concussion with loss of consciousness of unspecified duration, initial encounter: Secondary | ICD-10-CM | POA: Insufficient documentation

## 2015-05-30 DIAGNOSIS — H8190 Unspecified disorder of vestibular function, unspecified ear: Secondary | ICD-10-CM | POA: Insufficient documentation

## 2015-05-30 DIAGNOSIS — G44329 Chronic post-traumatic headache, not intractable: Secondary | ICD-10-CM | POA: Insufficient documentation

## 2015-06-26 ENCOUNTER — Ambulatory Visit (INDEPENDENT_AMBULATORY_CARE_PROVIDER_SITE_OTHER): Payer: BC Managed Care – PPO | Admitting: Allergy and Immunology

## 2015-06-26 ENCOUNTER — Encounter: Payer: Self-pay | Admitting: Allergy and Immunology

## 2015-06-26 VITALS — BP 120/89 | HR 98 | Temp 98.8°F | Resp 16

## 2015-06-26 DIAGNOSIS — J3089 Other allergic rhinitis: Secondary | ICD-10-CM | POA: Insufficient documentation

## 2015-06-26 DIAGNOSIS — J4540 Moderate persistent asthma, uncomplicated: Secondary | ICD-10-CM | POA: Insufficient documentation

## 2015-06-26 MED ORDER — BUDESONIDE-FORMOTEROL FUMARATE 160-4.5 MCG/ACT IN AERO: 2.0000 | INHALATION_SPRAY | Freq: Two times a day (BID) | RESPIRATORY_TRACT | Status: DC

## 2015-06-26 MED ORDER — MONTELUKAST SODIUM 10 MG PO TABS: 10.0000 mg | ORAL_TABLET | Freq: Every day | ORAL | Status: DC

## 2015-06-26 NOTE — Patient Instructions (Signed)
Moderate persistent asthma  For now, continue Symbicort 160/4.5, 2 inhalations via spacer device twice a day, montelukast 10 mg daily at bedtime, and albuterol HFA, 1-2 inhalations via spacer device every 4-6 hours as needed.  Subjective and objective measures of pulmonary function will be followed and the treatment plan will be adjusted accordingly.  Allergic rhinitis  Continue allergen avoidance measures, fluticasone nasal spray, and loratadine 10 mg daily as needed.   Return in about 4 months (around 10/24/2015), or if symptoms worsen or fail to improve.

## 2015-06-26 NOTE — Progress Notes (Signed)
History of present illness: HPI Comments: Jo Singh is a 29 y.o. female with persistent asthma and allergic rhinitis who presents today for follow up.  She reports that in the interval since her previous visit in June that her upper and lower respiratory symptoms a been well-controlled.  While taking Symbicort 160/4.5 g, she has rarely required albuterol rescue and her activities of daily living are not limited by asthma symptoms.  She states that when she decreased the dose of Symbicort 80/4.5 g, she had increased albterol requirement.  She has no nasal symptom complaints today. She requests refills for Symbicort 160/4.5 g and montelukast.   Assessment and plan: No problem-specific assessment & plan notes found for this encounter.   Medications ordered this encounter: Meds ordered this encounter  Medications  . montelukast (SINGULAIR) 10 MG tablet    Sig: Take 1 tablet (10 mg total) by mouth at bedtime.    Dispense:  30 tablet    Refill:  5  . budesonide-formoterol (SYMBICORT) 160-4.5 MCG/ACT inhaler    Sig: Inhale 2 puffs into the lungs 2 (two) times daily.    Dispense:  1 Inhaler    Refill:  5    Diagnositics: Spirometry: normal. Please see scanned spirometry results for details.     Physical examination: Blood pressure 120/89, pulse 98, temperature 98.8 F (37.1 C), temperature source Oral, resp. rate 16.  General: Alert, interactive, in no acute distress. HEENT: TMs pearly gray, turbinates mildly edematous without discharge, post-pharynx mildly erythematous. Neck: Supple without lymphadenopathy. Lungs: Clear to auscultation without wheezing, rhonchi or rales. CV: Normal S1, S2 without murmurs. Skin: Warm and dry, without lesions or rashes.  The following portions of the patient's history were reviewed and updated as appropriate: allergies, current medications, past family history, past medical history, past social history, past surgical history and problem  list.  Outpatient medications:   Medication List       This list is accurate as of: 06/26/15  5:29 PM.  Always use your most recent med list.               ALPRAZolam 1 MG tablet  Commonly known as:  XANAX  Take 1 mg by mouth 3 (three) times daily as needed for sleep.     AMITRIPTYLINE HCL PO  Take 40 mg by mouth daily.     budesonide-formoterol 160-4.5 MCG/ACT inhaler  Commonly known as:  SYMBICORT  Inhale 2 puffs into the lungs 2 (two) times daily.     fluticasone 50 MCG/ACT nasal spray  Commonly known as:  FLONASE  Place 2 sprays into both nostrils daily as needed for allergies or rhinitis.     loratadine 10 MG tablet  Commonly known as:  CLARITIN  Take 10 mg by mouth as needed for allergies.     Milnacipran 50 MG Tabs tablet  Commonly known as:  SAVELLA  Take 100 mg by mouth 2 (two) times daily.     montelukast 10 MG tablet  Commonly known as:  SINGULAIR  Take 1 tablet (10 mg total) by mouth at bedtime.     naproxen 250 MG tablet  Commonly known as:  NAPROSYN  Take 250 mg by mouth 2 (two) times daily with a meal.     pregabalin 150 MG capsule  Commonly known as:  LYRICA  Take 150 mg by mouth 2 (two) times daily.     VENTOLIN HFA 108 (90 BASE) MCG/ACT inhaler  Generic drug:  albuterol  Inhale 2 puffs into  the lungs every 6 (six) hours as needed for wheezing or shortness of breath.        Known medication allergies: Allergies  Allergen Reactions  . Latex Hives  . Darvocet [Propoxyphene N-Acetaminophen] Nausea And Vomiting  . Flexeril [Cyclobenzaprine]   . Meloxicam Nausea And Vomiting    I appreciate the opportunity to take part in this Jadzia's care. Please do not hesitate to contact me with questions.  Sincerely,   R. Jorene Guest, MD

## 2015-06-26 NOTE — Assessment & Plan Note (Signed)
   Continue allergen avoidance measures, fluticasone nasal spray, and loratadine 10 mg daily as needed.

## 2015-06-26 NOTE — Assessment & Plan Note (Signed)
   For now, continue Symbicort 160/4.5, 2 inhalations via spacer device twice a day, montelukast 10 mg daily at bedtime, and albuterol HFA, 1-2 inhalations via spacer device every 4-6 hours as needed.  Subjective and objective measures of pulmonary function will be followed and the treatment plan will be adjusted accordingly.

## 2015-08-11 IMAGING — CR DG SHOULDER 2+V*L*
3 series · 3 of 3 positions shown · non-contrast
Comparison: None.

CLINICAL DATA: Left shoulder pain.  No known injury.

EXAM:
LEFT SHOULDER - 2+ VIEW

[AP]
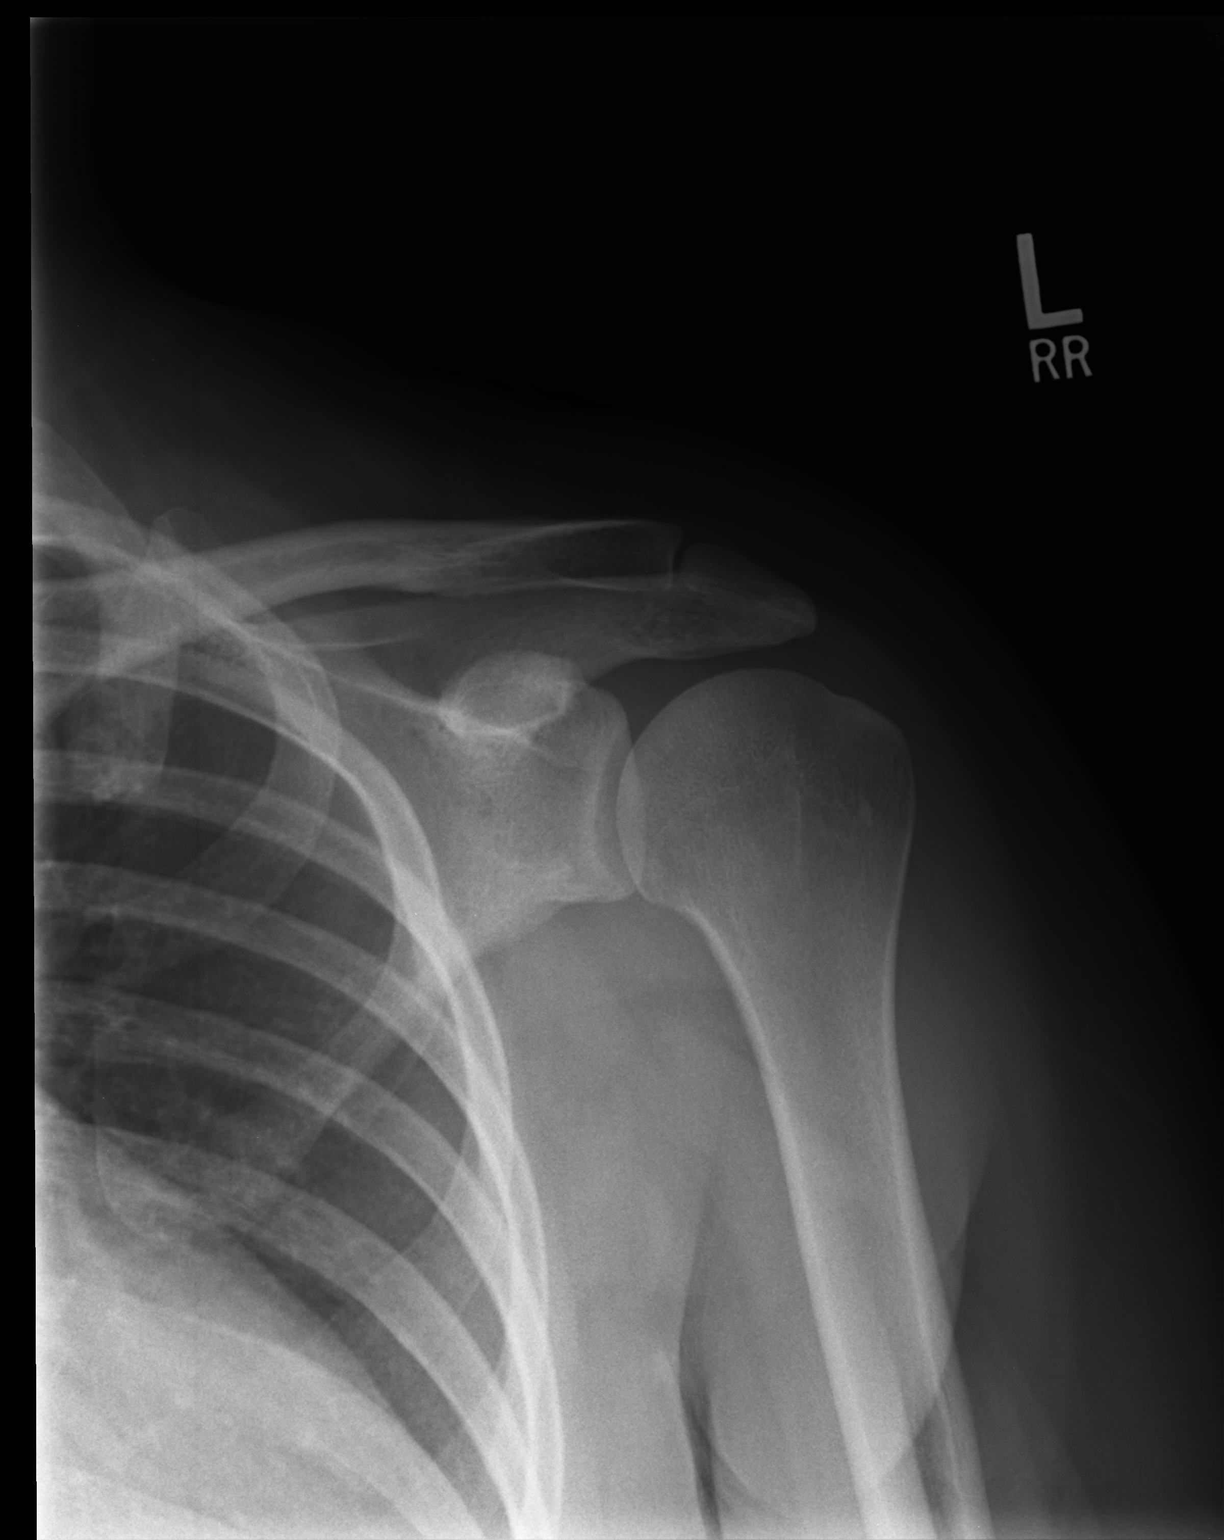

[pa y view]
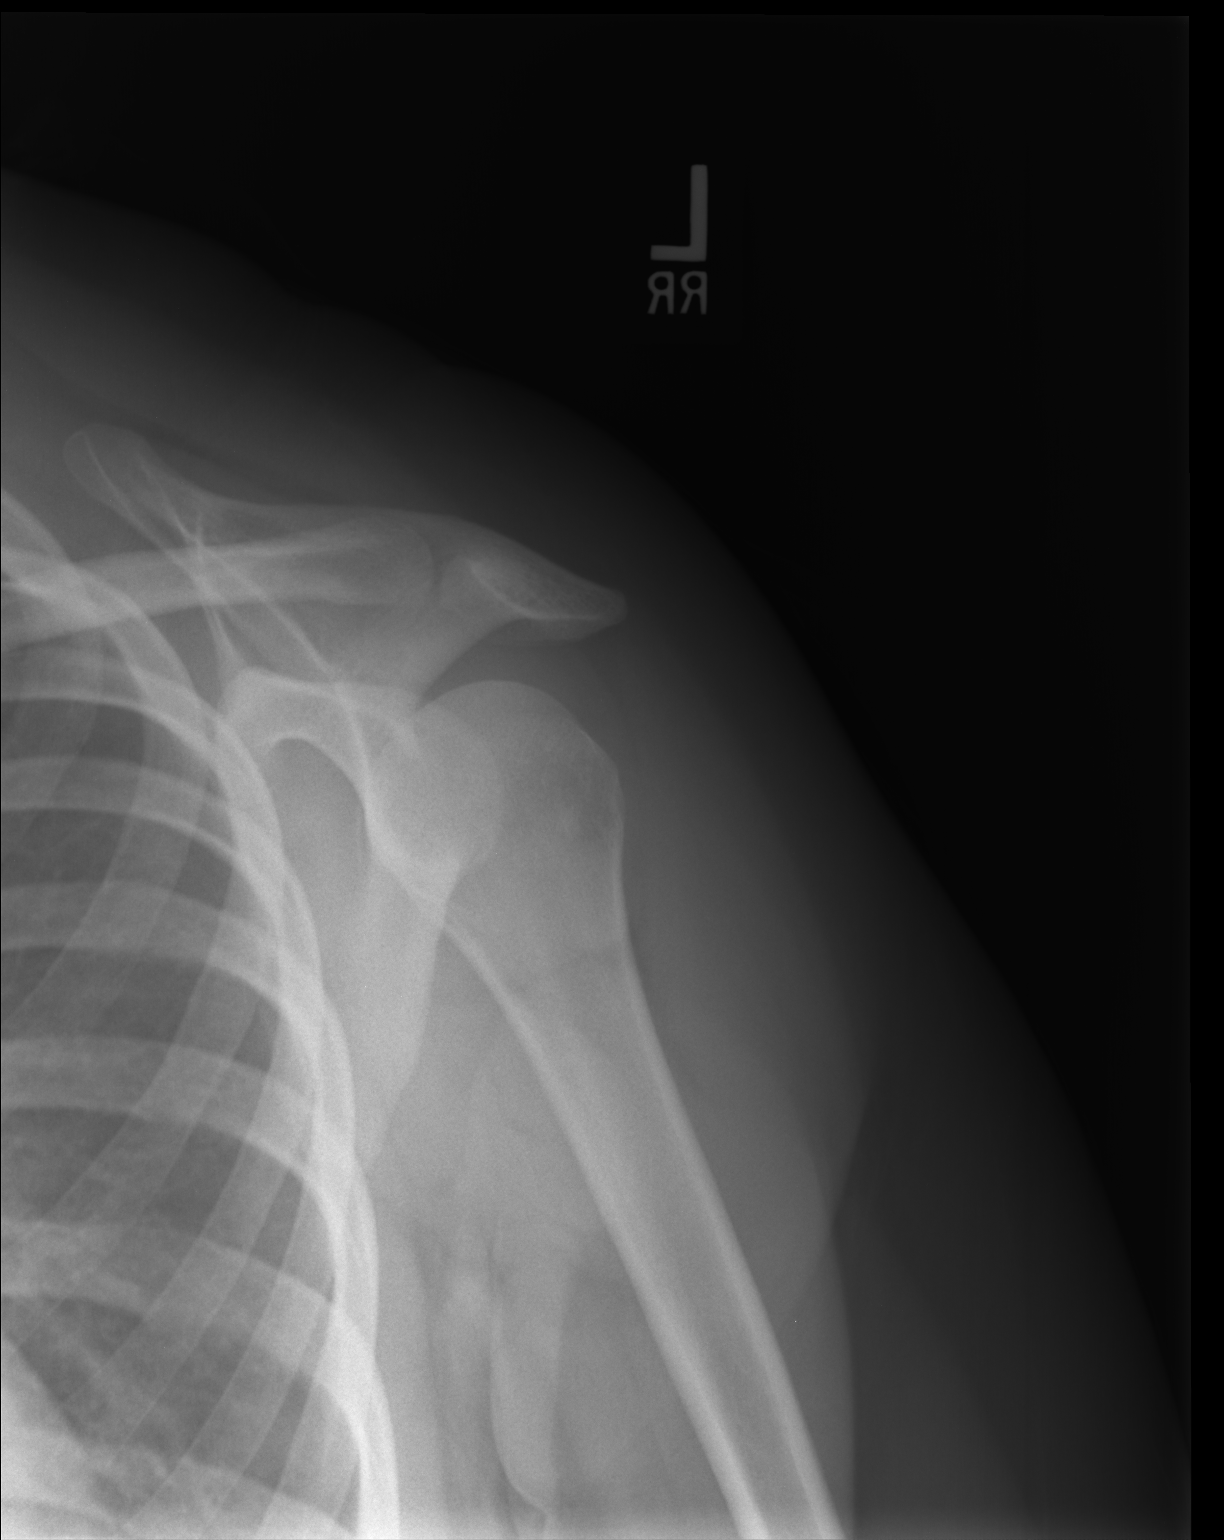

[axial]
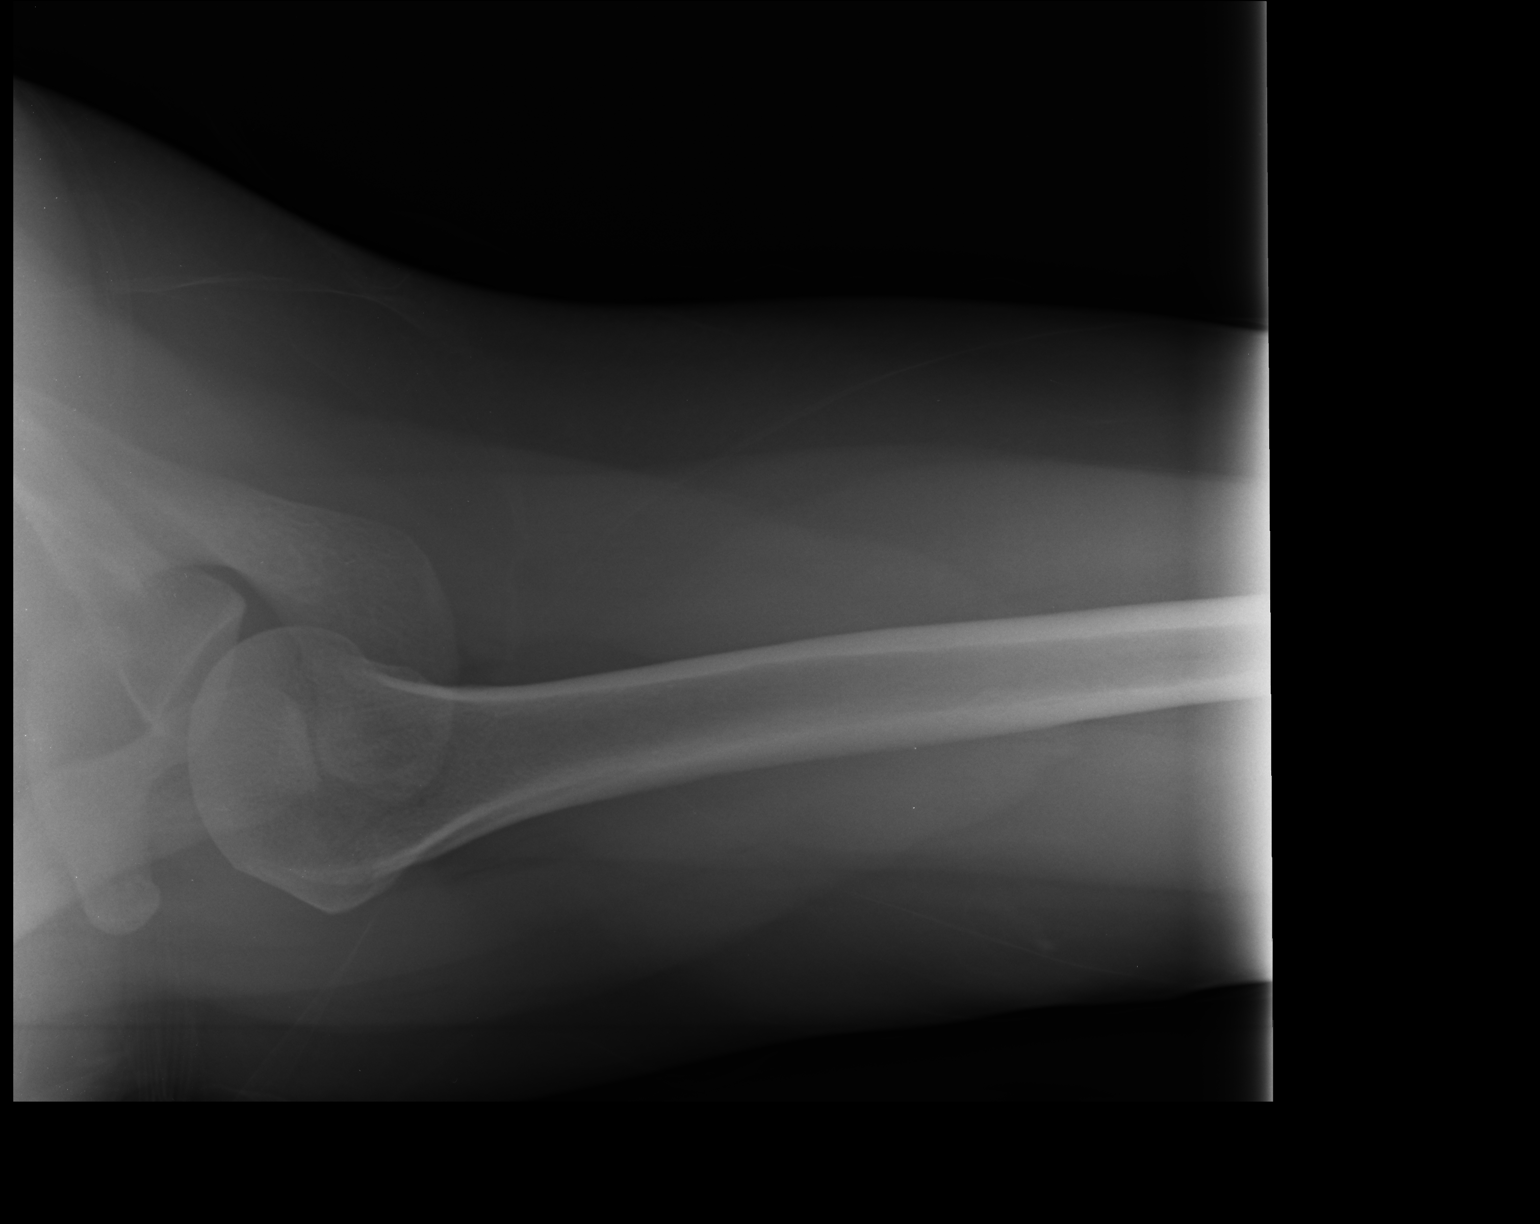

[3 of 3 positions shown; findings below may reference images not displayed]

FINDINGS: The mineralization and alignment are normal. There is no evidence of
acute fracture or dislocation. The subacromial space is preserved.
The acromioclavicular joint appears normal.
IMPRESSION: Normal examination.

## 2015-10-04 ENCOUNTER — Other Ambulatory Visit: Payer: Self-pay | Admitting: Allergy

## 2015-10-04 ENCOUNTER — Telehealth: Payer: Self-pay | Admitting: Allergy

## 2015-10-04 MED ORDER — MOMETASONE FURO-FORMOTEROL FUM 200-5 MCG/ACT IN AERO: 2.0000 | INHALATION_SPRAY | Freq: Two times a day (BID) | RESPIRATORY_TRACT | Status: DC

## 2015-10-04 NOTE — Telephone Encounter (Signed)
INSURANCE WILL NOT PAY FOR SYMBICORT. SHE HAS BEEN SEEING DR. BOBBITT BUT WILL BE SEEING YOU. PATIENT SAID SHE HAD TRIED ADVAIR BUT IT DIDN'T HELP. INSURANCE WILL PAY FOR BREO ELLIPTA OR DULERA. PLEASE ADVISE. HAVE INFORMED PATIENT INSURANCE WOULD NOT PAY FOR SYMBICORT.

## 2015-10-04 NOTE — Telephone Encounter (Signed)
CALLED IN RX FOR DULERA 2 PUFFS TWICE A DAY. INFORMED PATIENT.

## 2015-10-04 NOTE — Telephone Encounter (Signed)
Please switch from symbicort to [facility] 200 mcg 2 puffs twice a day

## 2015-10-23 ENCOUNTER — Ambulatory Visit: Payer: BC Managed Care – PPO | Admitting: Internal Medicine

## 2015-11-27 ENCOUNTER — Encounter: Payer: Self-pay | Admitting: Internal Medicine

## 2015-11-27 ENCOUNTER — Ambulatory Visit (INDEPENDENT_AMBULATORY_CARE_PROVIDER_SITE_OTHER): Payer: BC Managed Care – PPO | Admitting: Internal Medicine

## 2015-11-27 VITALS — BP 128/84 | HR 120 | Temp 98.8°F | Resp 20 | Ht 65.8 in | Wt 224.4 lb

## 2015-11-27 DIAGNOSIS — J3089 Other allergic rhinitis: Secondary | ICD-10-CM | POA: Diagnosis not present

## 2015-11-27 DIAGNOSIS — J4540 Moderate persistent asthma, uncomplicated: Secondary | ICD-10-CM

## 2015-11-27 MED ORDER — MONTELUKAST SODIUM 10 MG PO TABS: 10.0000 mg | ORAL_TABLET | Freq: Every day | ORAL | Status: DC

## 2015-11-27 MED ORDER — FLUCONAZOLE 100 MG PO TABS: ORAL_TABLET | ORAL | Status: DC

## 2015-11-27 MED ORDER — ALBUTEROL SULFATE HFA 108 (90 BASE) MCG/ACT IN AERS: 2.0000 | INHALATION_SPRAY | RESPIRATORY_TRACT | Status: AC | PRN

## 2015-11-27 MED ORDER — FLUTICASONE PROPIONATE 50 MCG/ACT NA SUSP: 2.0000 | Freq: Every day | NASAL | Status: DC

## 2015-11-27 MED ORDER — FLUCONAZOLE 150 MG PO TABS: ORAL_TABLET | ORAL | Status: DC

## 2015-11-27 MED ORDER — BUDESONIDE-FORMOTEROL FUMARATE 160-4.5 MCG/ACT IN AERO: 2.0000 | INHALATION_SPRAY | Freq: Two times a day (BID) | RESPIRATORY_TRACT | Status: AC

## 2015-11-27 NOTE — Patient Instructions (Signed)
Moderate persistent asthma  Currently well controlled  Continue Symbicort 160 mcg 2 puffs twice a day  Continue Singulair 10 mg daily  Continue preexercise albuterol and as needed  For thrush, I will give Diflucan 200 mg on day #1 then 100 mg on days 2 through 5  Allergic rhinitis  Improved compared to previous years  Continue loratadine 10 mg daily, Singulair as above  Start fluticasone 2 sprays each nostril daily

## 2015-11-27 NOTE — Assessment & Plan Note (Signed)
   Currently well controlled  Continue Symbicort 160 mcg 2 puffs twice a day  Continue Singulair 10 mg daily  Continue preexercise albuterol and as needed  For thrush, I will give Diflucan 200 mg on day #1 then 100 mg on days 2 through 5

## 2015-11-27 NOTE — Assessment & Plan Note (Signed)
   Improved compared to previous years  Continue loratadine 10 mg daily, Singulair as above  Start fluticasone 2 sprays each nostril daily

## 2015-11-27 NOTE — Progress Notes (Signed)
History of Present Illness: Jo Singh is a 30 y.o. female presenting for follow-up  HPI Comments: Asthma: Patient was switched from Symbicort to [facility] due to insurance noncoverage of Symbicort. After using the [facility], she developed thrush. Her primary care physician has treated her with nystatin and clotrimazole troche with only partial improvement. She would like to go back on the Symbicort as she did not have any problems with rash while on this medication. She has not had a severe exacerbations. She uses albuterol prior to exercise and once every few weeks as needed.  Allergic rhinitis: Skin testing in the past was positive for mold. She has been having good symptom control. She has had 2 sinus infections in the past year and prior to that it had been numerous. She would like a nasal spray.   Current Outpatient Prescriptions on File Prior to Visit  Medication Sig Dispense Refill  . ALPRAZolam (XANAX) 1 MG tablet Take 1 mg by mouth 3 (three) times daily as needed for sleep.    Marland Kitchen loratadine (CLARITIN) 10 MG tablet Take 10 mg by mouth as needed for allergies.    . pregabalin (LYRICA) 150 MG capsule Take 150 mg by mouth 2 (two) times daily.     No current facility-administered medications on file prior to visit.    Assessment and Plan: Moderate persistent asthma  Currently well controlled  Continue Symbicort 160 mcg 2 puffs twice a day  Continue Singulair 10 mg daily  Continue preexercise albuterol and as needed  For thrush, I will give Diflucan 200 mg on day #1 then 100 mg on days 2 through 5  Allergic rhinitis  Improved compared to previous years  Continue loratadine 10 mg daily, Singulair as above  Start fluticasone 2 sprays each nostril daily    Return in about 6 months (around 05/28/2016).  Meds ordered this encounter  Medications  . DISCONTD: butalbital-acetaminophen-caffeine (FIORICET, ESGIC) 50-325-40 MG tablet    Sig: Take by mouth.  . DISCONTD: ALPRAZolam  (NIRAVAM) 1 MG dissolvable tablet    Sig: Take by mouth.  Marland Kitchen amitriptyline (ELAVIL) 10 MG tablet    Sig: Take 40 mg by mouth.  . DISCONTD: pregabalin (LYRICA) 150 MG capsule    Sig: Take by mouth.  . DISCONTD: montelukast (SINGULAIR) 10 MG tablet    Sig: Take by mouth.  . DISCONTD: albuterol (PROAIR HFA) 108 (90 Base) MCG/ACT inhaler    Sig: Inhale into the lungs.  . Milnacipran HCl (SAVELLA) 100 MG TABS tablet    Sig: Take by mouth.  . DISCONTD: budesonide-formoterol (SYMBICORT) 160-4.5 MCG/ACT inhaler    Sig: Inhale into the lungs.  Marland Kitchen DISCONTD: acetaminophen-codeine (TYLENOL #3) 300-30 MG tablet    Sig: take 1 tablet by mouth three times a day if needed for pain    Refill:  0  . DISCONTD: ALPRAZolam (NIRAVAM) 1 MG dissolvable tablet    Sig: dissolve 1 tablet by mouth three times a day if needed    Refill:  0  . DISCONTD: amitriptyline (ELAVIL) 10 MG tablet    Sig:     Refill:  0  . DISCONTD: clotrimazole (MYCELEX) 10 MG troche    Sig: dissolve 1 troche by mouth or THROAT SLOWLY five times a day    Refill:  0  . Vitamin D, Ergocalciferol, (DRISDOL) 50000 units CAPS capsule    Sig: Take 50,000 Units by mouth once a week.    Refill:  0  . ondansetron (ZOFRAN) 4 MG tablet  Sig: take 1 tablet by mouth three times a day if needed    Refill:  0  . DISCONTD: nystatin (MYCOSTATIN) 100000 UNIT/ML suspension    Sig: swish and swallow 4 milliliters to 6 milliliters by mouth four times a day    Refill:  0  . albuterol (VENTOLIN HFA) 108 (90 Base) MCG/ACT inhaler    Sig: Inhale 2 puffs into the lungs every 4 (four) hours as needed for wheezing or shortness of breath.    Dispense:  8 g    Refill:  1  . montelukast (SINGULAIR) 10 MG tablet    Sig: Take 1 tablet (10 mg total) by mouth at bedtime.    Dispense:  30 tablet    Refill:  5  . fluticasone (FLONASE) 50 MCG/ACT nasal spray    Sig: Place 2 sprays into both nostrils daily.    Dispense:  16 g    Refill:  5  . DISCONTD:  fluconazole (DIFLUCAN) 150 MG tablet    Sig: TWO TABLETS ON DAY ONE, THEN ONE TABLET DAYS 2 THROUGH 5.    Dispense:  6 tablet    Refill:  0  . budesonide-formoterol (SYMBICORT) 160-4.5 MCG/ACT inhaler    Sig: Inhale 2 puffs into the lungs 2 (two) times daily.    Dispense:  1 Inhaler    Refill:  5  . fluconazole (DIFLUCAN) 100 MG tablet    Sig: TWO TABLETS ON DAY ONE, THEN ONE TABLET DAYS 2 THROUGH 5.    Dispense:  6 tablet    Refill:  0    Diagnostics: Spirometry: FEV1 2.93L or 86%, FEV1/FVC  89%.  This is a normal study  Physical Exam: BP 128/84 mmHg  Pulse 120  Temp(Src) 98.8 F (37.1 C) (Oral)  Resp 20  Ht 5' 5.8" (1.671 m)  Wt 224 lb 6.4 oz (101.787 kg)  BMI 36.45 kg/m2   Physical Exam  Constitutional: She appears well-developed and well-nourished. No distress.  HENT:  Right Ear: External ear normal.  Left Ear: External ear normal.  Nose: Nose normal.  Mouth/Throat: No oropharyngeal exudate ( mild erythema).  Eyes: Conjunctivae are normal. Right eye exhibits no discharge. Left eye exhibits no discharge.  Cardiovascular: Normal rate, regular rhythm and normal heart sounds.   No murmur heard. Pulmonary/Chest: Effort normal and breath sounds normal. No respiratory distress. She has no wheezes. She has no rales.  Abdominal: Soft. Bowel sounds are normal.  Musculoskeletal: She exhibits no edema.  Lymphadenopathy:    She has no cervical adenopathy.  Neurological: She is alert.  Skin: No rash noted.  Vitals reviewed.   Drug Allergies:  Allergies  Allergen Reactions  . Latex Hives and Rash  . Flexeril [Cyclobenzaprine] Rash  . Propoxyphene Nausea And Vomiting  . Darvocet [Propoxyphene N-Acetaminophen] Nausea And Vomiting  . Meloxicam Nausea And Vomiting    ROS: Per HPI unless specifically indicated below Review of Systems  Thank you for the opportunity to care for this patient.  Please do not hesitate to contact me with questions.

## 2016-06-03 ENCOUNTER — Ambulatory Visit: Payer: BC Managed Care – PPO | Admitting: Pediatrics

## 2016-07-17 ENCOUNTER — Encounter: Payer: Self-pay | Admitting: Allergy and Immunology

## 2016-07-17 ENCOUNTER — Ambulatory Visit (INDEPENDENT_AMBULATORY_CARE_PROVIDER_SITE_OTHER): Payer: BC Managed Care – PPO | Admitting: Allergy and Immunology

## 2016-07-17 VITALS — BP 130/89 | HR 78 | Temp 98.1°F | Resp 16

## 2016-07-17 DIAGNOSIS — J45901 Unspecified asthma with (acute) exacerbation: Secondary | ICD-10-CM | POA: Diagnosis not present

## 2016-07-17 DIAGNOSIS — J0100 Acute maxillary sinusitis, unspecified: Secondary | ICD-10-CM | POA: Diagnosis not present

## 2016-07-17 DIAGNOSIS — J3089 Other allergic rhinitis: Secondary | ICD-10-CM

## 2016-07-17 DIAGNOSIS — J019 Acute sinusitis, unspecified: Secondary | ICD-10-CM | POA: Insufficient documentation

## 2016-07-17 MED ORDER — PREDNISONE 1 MG PO TABS: 10.0000 mg | ORAL_TABLET | Freq: Every day | ORAL | Status: DC

## 2016-07-17 MED ORDER — AZELASTINE HCL 0.1 % NA SOLN: 2.0000 | Freq: Two times a day (BID) | NASAL | 5 refills | Status: DC

## 2016-07-17 NOTE — Assessment & Plan Note (Signed)
   Continue/finish course of levofloxacin as prescribed by primary care physician.  Prednisone course will be extended.  Additional prednisone has been provided, 40 mg x3 days, 20 mg x1 day, 10 mg x1 day, then stop.  A prescription has been provided for azelastine nasal spray, 2 sprays per nostril 2 times daily. Proper nasal spray technique has been discussed and demonstrated.   Continue fluticasone nasal spray, one spray per nostril twice a day.  Nasal saline lavage (NeilMed) as needed has been recommended prior to medicated nasal sprays and as needed along with instructions for proper administration.  For thick post nasal drainage, nasal congestion, and/or sinus pressure, add guaifenesin 1200 mg (Mucinex Maximum Strength) plus/minus pseudoephedrine 120 mg  twice daily as needed with adequate hydration as discussed. Pseudoephedrine is only to be used for short-term relief of nasal/sinus congestion. Long-term use is discouraged due to potential side effects.

## 2016-07-17 NOTE — Patient Instructions (Addendum)
Acute sinusitis  Continue/finish course of levofloxacin as prescribed by primary care physician.  Prednisone course will be extended.  Additional prednisone has been provided, 40 mg x3 days, 20 mg x1 day, 10 mg x1 day, then stop.  A prescription has been provided for azelastine nasal spray, 2 sprays per nostril 2 times daily. Proper nasal spray technique has been discussed and demonstrated.   Continue fluticasone nasal spray, one spray per nostril twice a day.  Nasal saline lavage (NeilMed) as needed has been recommended prior to medicated nasal sprays and as needed along with instructions for proper administration.  For thick post nasal drainage, nasal congestion, and/or sinus pressure, add guaifenesin 1200 mg (Mucinex Maximum Strength) plus/minus pseudoephedrine 120 mg  twice daily as needed with adequate hydration as discussed. Pseudoephedrine is only to be used for short-term relief of nasal/sinus congestion. Long-term use is discouraged due to potential side effects.  Asthma with acute exacerbation  Prednisone has been provided (as above).  Continue Symbicort 16 assess 4.5 g, 2 inhalations via spacer device twice a day.  Unfortunately Jo Singh is unable to tolerate higher doses of inhaled corticosteroids due to oral candidiasis.  Continue montelukast 10 mg daily at bedtime and albuterol HFA, 1-2 inhalations every 4-6 hours as needed.  The patient has been asked to contact me if her symptoms persist or progress.   A lab order form has been provided for CBC with differential and serum total IgE level to assess candidacy for omalizumab, benralizumab, or mepolizomab.  These labs are not be drawn until the patient has been off of prednisone for at least 10 days.  Allergic rhinitis  Continue appropriate allergen avoidance measures, montelukast daily, and fluticasone nasal spray.  Azelastine nasal spray has been prescribed a (as above).  In anticipation of initiating immunotherapy, the  patient is scheduled to return for aeroallergen retest to ensure accurate and comprehensive vials.     Return in about 4 weeks (around 08/14/2016), or if symptoms worsen or fail to improve.

## 2016-07-17 NOTE — Assessment & Plan Note (Addendum)
   Prednisone has been provided (as above).  Continue Symbicort 16 assess 4.5 g, 2 inhalations via spacer device twice a day.  Unfortunately Jo Singh is unable to tolerate higher doses of inhaled corticosteroids due to oral candidiasis.  Continue montelukast 10 mg daily at bedtime and albuterol HFA, 1-2 inhalations every 4-6 hours as needed.  The patient has been asked to contact me if her symptoms persist or progress.   A lab order form has been provided for CBC with differential and serum total IgE level to assess candidacy for omalizumab, benralizumab, or mepolizomab.  These labs are not be drawn until the patient has been off of prednisone for at least 10 days.

## 2016-07-17 NOTE — Progress Notes (Signed)
Follow-up Note  RE: Jo Singh MRN: [id number] DOB: July 05, 1986 Date of Office Visit: 07/17/2016  Primary care provider: Maye HidesMILLER, RYAN DEAN, PA Referring provider: Maye HidesMiller, Ryan Dean, PA  History of present illness: Jo SpindleKathryn Singh is a 30 y.o. female with persistent asthma and allergic rhinitis presenting today for a sick visit.  She was last seen in this clinic in October 2016.  She reports that she has had a series of sinus infections requiring multiple rounds of antibiotics over the past few months.  3 days ago she was started on a course of levofloxacin and 2 days ago was started on prednisone taper.  She has 2 days left of the levofloxacin and 3 days left of the prednisone taper.  She denies significant symptom relief over the past few days.  She reports that she requires albuterol rescue 5 times per week on average despite compliance with Symbicort 160/4.5 g, 2 inhalations via spacer device twice a day, and montelukast 10 mg daily at bedtime.  She has tried Dulera 200/5 g in the past, however discontinued this medication because of thrush despite the fact that she had been using a spacer device and rinsing her mouth appropriately.  She believes that her upper and lower respiratory tract symptoms are triggered by mold exposure.  She is interested in the possibility of starting aeroallergen immunotherapy to reduce symptoms and decrease medication requirement.    Assessment and plan: Acute sinusitis  Continue/finish course of levofloxacin as prescribed by primary care physician.  Prednisone course will be extended.  Additional prednisone has been provided, 40 mg x3 days, 20 mg x1 day, 10 mg x1 day, then stop.  A prescription has been provided for azelastine nasal spray, 2 sprays per nostril 2 times daily. Proper nasal spray technique has been discussed and demonstrated.   Continue fluticasone nasal spray, one spray per nostril twice a day.  Nasal saline lavage (NeilMed) as needed  has been recommended prior to medicated nasal sprays and as needed along with instructions for proper administration.  For thick post nasal drainage, nasal congestion, and/or sinus pressure, add guaifenesin 1200 mg (Mucinex Maximum Strength) plus/minus pseudoephedrine 120 mg  twice daily as needed with adequate hydration as discussed. Pseudoephedrine is only to be used for short-term relief of nasal/sinus congestion. Long-term use is discouraged due to potential side effects.  Asthma with acute exacerbation  Prednisone has been provided (as above).  Continue Symbicort 16 assess 4.5 g, 2 inhalations via spacer device twice a day.  Unfortunately Jo Singh is unable to tolerate higher doses of inhaled corticosteroids due to oral candidiasis.  Continue montelukast 10 mg daily at bedtime and albuterol HFA, 1-2 inhalations every 4-6 hours as needed.  The patient has been asked to contact me if her symptoms persist or progress.   A lab order form has been provided for CBC with differential and serum total IgE level to assess candidacy for omalizumab, benralizumab, or mepolizomab.  These labs are not be drawn until the patient has been off of prednisone for at least 10 days.  Allergic rhinitis  Continue appropriate allergen avoidance measures, montelukast daily, and fluticasone nasal spray.  Azelastine nasal spray has been prescribed a (as above).  In anticipation of initiating immunotherapy, the patient is scheduled to return for aeroallergen retest to ensure accurate and comprehensive vials.     Meds ordered this encounter  Medications  . azelastine (ASTELIN) 0.1 % nasal spray    Sig: Place 2 sprays into both nostrils 2 (two) times  daily. Use in each nostril as directed    Dispense:  30 mL    Refill:  5    Diagnostics: Spirometry reveals an FVC of 3.42 L and an FEV1 of 3.08 L without significant post bronchodilator improvement.  Please see scanned spirometry results for details.     Physical examination: Blood pressure 130/89, pulse 78, temperature 98.1 F (36.7 C), temperature source Oral, resp. rate 16.  General: Alert, interactive, in no acute distress. HEENT: TMs pearly gray, turbinates edematous with thick discharge, post-pharynx erythematous. Neck: Supple without lymphadenopathy. Lungs: Mildly decreased breath sounds bilaterally without wheezing, rhonchi or rales. CV: Normal S1, S2 without murmurs. Skin: Warm and dry, without lesions or rashes.  The following portions of the patient's history were reviewed and updated as appropriate: allergies, current medications, past family history, past medical history, past social history, past surgical history and problem list.    Medication List       Accurate as of 07/17/16  6:53 PM. Always use your most recent med list.          albuterol 108 (90 Base) MCG/ACT inhaler Commonly known as:  VENTOLIN HFA Inhale 2 puffs into the lungs every 4 (four) hours as needed for wheezing or shortness of breath.   ALPRAZolam 1 MG tablet Commonly known as:  XANAX Take 1 mg by mouth 3 (three) times daily as needed for sleep.   azelastine 0.1 % nasal spray Commonly known as:  ASTELIN Place 2 sprays into both nostrils 2 (two) times daily. Use in each nostril as directed   budesonide-formoterol 160-4.5 MCG/ACT inhaler Commonly known as:  SYMBICORT Inhale 2 puffs into the lungs 2 (two) times daily.   fluconazole 100 MG tablet Commonly known as:  DIFLUCAN TWO TABLETS ON DAY ONE, THEN ONE TABLET DAYS 2 THROUGH 5.   fluticasone 50 MCG/ACT nasal spray Commonly known as:  FLONASE Place 2 sprays into both nostrils daily.   HYDROMET 5-1.5 MG/5ML syrup Generic drug:  HYDROcodone-homatropine   levofloxacin 500 MG tablet Commonly known as:  LEVAQUIN   loratadine 10 MG tablet Commonly known as:  CLARITIN Take 10 mg by mouth as needed for allergies.   montelukast 10 MG tablet Commonly known as:  SINGULAIR Take 1 tablet  (10 mg total) by mouth at bedtime.   predniSONE 20 MG tablet Commonly known as:  DELTASONE   pregabalin 150 MG capsule Commonly known as:  LYRICA Take 150 mg by mouth 2 (two) times daily.   SAVELLA 100 MG Tabs tablet Generic drug:  Milnacipran HCl Take by mouth.       Allergies  Allergen Reactions  . Latex Hives and Rash  . Flexeril [Cyclobenzaprine] Rash  . Molds & Smuts     Other reaction(s): Other Sinus issues  . Propoxyphene Nausea And Vomiting  . Darvocet [Propoxyphene N-Acetaminophen] Nausea And Vomiting  . Meloxicam Nausea And Vomiting   Review of systems: Review of systems negative except as noted in HPI / PMHx or noted below: Constitutional: Negative.  HENT: Negative.   Eyes: Negative.  Respiratory: Negative.   Cardiovascular: Negative.  Gastrointestinal: Negative.  Genitourinary: Negative.  Musculoskeletal: Negative.  Neurological: Negative.  Endo/Heme/Allergies: Negative.  Cutaneous: Negative.  Past Medical History:  Diagnosis Date  . Anxiety   . Fibromyalgia     Family History  Problem Relation Age of Onset  . Cancer Maternal Grandmother   . Allergic rhinitis Neg Hx   . Asthma Neg Hx   . Angioedema Neg Hx   .  Eczema Neg Hx   . Urticaria Neg Hx   . Immunodeficiency Neg Hx     Social History   Social History  . Marital status: Single    Spouse name: N/A  . Number of children: N/A  . Years of education: N/A   Occupational History  . Not on file.   Social History Main Topics  . Smoking status: Never Smoker  . Smokeless tobacco: Never Used  . Alcohol use No  . Drug use: No  . Sexual activity: Yes    Birth control/ protection: Pill   Other Topics Concern  . Not on file   Social History Narrative  . No narrative on file    I appreciate the opportunity to take part in [place]'s care. Please do not hesitate to contact me with questions.  Sincerely,   R. Jorene Guestarter Yolanda Huffstetler, MD

## 2016-07-17 NOTE — Assessment & Plan Note (Signed)
   Continue appropriate allergen avoidance measures, montelukast daily, and fluticasone nasal spray.  Azelastine nasal spray has been prescribed a (as above).  In anticipation of initiating immunotherapy, the patient is scheduled to return for aeroallergen retest to ensure accurate and comprehensive vials.

## 2019-09-09 ENCOUNTER — Other Ambulatory Visit: Payer: Self-pay

## 2019-09-09 ENCOUNTER — Encounter: Payer: Self-pay | Admitting: Emergency Medicine

## 2019-09-09 ENCOUNTER — Emergency Department
Admission: EM | Admit: 2019-09-09 | Discharge: 2019-09-09 | Disposition: A | Discharge status: Home / Self Care | Payer: BC Managed Care – PPO | Source: Home / Self Care

## 2019-09-09 DIAGNOSIS — M5431 Sciatica, right side: Secondary | ICD-10-CM

## 2019-09-09 MED ORDER — PREDNISONE 20 MG PO TABS: 20.0000 mg | ORAL_TABLET | Freq: Two times a day (BID) | ORAL | 0 refills | Status: DC

## 2019-09-09 MED ORDER — HYDROCODONE-ACETAMINOPHEN 5-325 MG PO TABS: 1.0000 | ORAL_TABLET | Freq: Four times a day (QID) | ORAL | 0 refills | Status: DC | PRN

## 2019-09-09 NOTE — Discharge Instructions (Addendum)
Adult vitamin D dose: 5000 international units/day  34-year-old dose is anywhere from 1500 international units to 2000 international units/day  51-year-old dose is 2500 international units/day  Please return if you are not improving with the medication ordered.

## 2019-09-09 NOTE — ED Provider Notes (Signed)
[facility]    CSN: [id number] Arrival date & time: 09/09/19  1655      History   Chief Complaint Chief Complaint  Patient presents with  . Back Pain    HPI Jo Singh is a 34 y.o. female.   This is the initial [name] urgent care visit for this 34 year old woman with back pain. Currently seeing physical therapy for lower back pain. 4 nights ago she woke up with new lower back pain that radiates down right leg. Pain has continued.   Patient has been going to a chiropractor but this has not helped.  She is signed up for a physical therapist but that will not start until February.  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     Past Medical History:  Diagnosis Date  . Anxiety   . Fibromyalgia     Patient Active Problem List   Diagnosis Date Noted  . Acute sinusitis 07/17/2016  . Asthma with acute exacerbation 07/17/2016  . Moderate persistent asthma 06/26/2015  . Allergic rhinitis 06/26/2015  . 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 Concussion with < 1 hr loss of consciousness 05/30/2015  . 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 Fibromyalgia 12/01/2013    Past Surgical History:  Procedure Laterality Date  . KNEE ARTHROSCOPY     left knee 2012    OB History   No obstetric history on file.      Home Medications    Prior to Admission medications   Medication Sig Start Date End Date Taking? Authorizing Provider  albuterol (VENTOLIN HFA) 108 (90 Base) MCG/ACT inhaler Inhale 2 puffs into the lungs every 4 (four) hours as needed for wheezing or shortness of breath. 11/27/15  Yes Mikki Santee, MD  ALPRAZolam Prudy Feeler) 1 MG tablet Take 1 mg by mouth 3 (three) times daily as needed for sleep.   Yes [provider]  levocetirizine (XYZAL) 5 MG tablet Take 5 mg by mouth every evening.   Yes [provider]  Milnacipran HCl (SAVELLA) 100 MG TABS tablet Take by mouth. 05/04/15  Yes [provider]  pregabalin (LYRICA) 150 MG capsule Take 150 mg by mouth 2 (two) times daily.   Yes [provider]  azelastine (ASTELIN) 0.1 % nasal spray Place 2 sprays into both nostrils 2 (two) times daily. Use in each nostril as directed 07/17/16   Bobbitt, Heywood Iles, MD  budesonide-formoterol [facility]) 160-4.5 MCG/ACT inhaler Inhale 2 puffs into the lungs 2 (two) times daily. 11/27/15   Mikki Santee, MD  fluticasone (FLONASE) 50 MCG/ACT nasal spray Place 2 sprays into both nostrils daily. 11/27/15   Mikki Santee, MD  HYDROcodone-acetaminophen (NORCO) 5-325 MG tablet Take 1 tablet by mouth every 6 (six) hours as needed for moderate pain. 09/09/19   Elvina Sidle, MD  HYDROMET 5-1.5 MG/5ML syrup  07/14/16   [provider]  levofloxacin (LEVAQUIN) 500 MG tablet  07/14/16   [provider]  loratadine (CLARITIN) 10 MG tablet Take 10 mg by mouth as needed for allergies.    [provider]  montelukast (SINGULAIR) 10 MG tablet Take 1 tablet (10 mg total) by mouth at bedtime. Patient not taking: Reported on 07/17/2016 11/27/15   Mikki Santee, MD  predniSONE (DELTASONE) 20 MG tablet Take 1 tablet (20 mg total) by mouth 2 (two) times daily with a meal. 09/09/19   Elvina Sidle, MD    Family History Family History  Problem Relation Age of Onset  .  Cancer Maternal Grandmother   . Allergic rhinitis Neg Hx   . Asthma Neg Hx   . Angioedema Neg Hx   . Eczema Neg Hx   . Urticaria Neg Hx   . Immunodeficiency Neg Hx     Social History Social History   Tobacco Use  . Smoking status: Never Smoker  . Smokeless tobacco: Never Used  Substance Use Topics  . Alcohol use: No  . Drug use: No     Allergies   Latex, Flexeril [cyclobenzaprine], Molds & smuts, Propoxyphene, Darvocet [propoxyphene n-acetaminophen], and Meloxicam   Review of Systems Review of Systems   Physical Exam Triage Vital Signs ED Triage Vitals [09/09/19 1723]  Enc Vitals Group       BP 133/90     Pulse Rate (!) 112     Resp 16     Temp 99 F (37.2 C)     Temp Source Oral     SpO2 100 %     Weight      Height      Head Circumference      Peak Flow      Pain Score 7     Pain Loc      Pain Edu?      Excl. in [place]?    No data found.  Updated Vital Signs BP 133/90   Pulse (!) 112 Comment: Pt reports this is normal for her.  Temp 99 F (37.2 C) (Oral)   Resp 16   LMP 08/29/2019   SpO2 100%    Physical Exam Vitals and nursing note reviewed.  Constitutional:      General: She is not in acute distress.    Appearance: Normal appearance. She is obese.  HENT:     Mouth/Throat:     Mouth: Mucous membranes are moist.  Eyes:     Conjunctiva/sclera: Conjunctivae normal.  Cardiovascular:     Rate and Rhythm: Normal rate.  Pulmonary:     Effort: Pulmonary effort is normal.  Musculoskeletal:     Cervical back: Normal range of motion and neck supple.     Comments: Positive SLR on right only  Skin:    General: Skin is warm and dry.  Neurological:     General: No focal deficit present.     Mental Status: She is alert.  Psychiatric:        Mood and Affect: Mood normal.        Behavior: Behavior normal.        Thought Content: Thought content normal.        Judgment: Judgment normal.      UC Treatments / Results  Labs (all labs ordered are listed, but only abnormal results are displayed) Labs Reviewed - No data to display  EKG   Radiology No results found.  Procedures Procedures (including critical care time)  Medications Ordered in UC Medications - No data to display  Initial Impression / Assessment and Plan / UC Course  I have reviewed the triage vital signs and the nursing notes.  Pertinent labs & imaging results that were available during my care of the patient were reviewed by me and considered in my medical decision making (see chart for details).    Final Clinical Impressions(s) / UC Diagnoses   Final diagnoses:  Sciatica of  right side     Discharge Instructions     Adult vitamin D dose: 5000 international units/day  43-year-old dose is anywhere from 1500 international units to 2000  international units/day  71-year-old dose is 2500 international units/day  Please return if you are not improving with the medication ordered.    ED Prescriptions    Medication Sig Dispense Auth. Provider   predniSONE (DELTASONE) 20 MG tablet Take 1 tablet (20 mg total) by mouth 2 (two) times daily with a meal. 14 tablet Elvina Sidle, MD   HYDROcodone-acetaminophen (NORCO) 5-325 MG tablet Take 1 tablet by mouth every 6 (six) hours as needed for moderate pain. 15 tablet Elvina Sidle, MD     I have reviewed the PDMP during this encounter.   Elvina Sidle, MD 09/09/19 1749

## 2019-09-09 NOTE — ED Triage Notes (Signed)
Currently seeing physical therapy for lower back pain. 4 nights ago she woke up with new lower back pain that radiates down right leg. Pain has continued.

## 2019-11-04 ENCOUNTER — Other Ambulatory Visit: Payer: Self-pay

## 2019-11-04 ENCOUNTER — Emergency Department
Admission: EM | Admit: 2019-11-04 | Discharge: 2019-11-04 | Disposition: A | Discharge status: Home / Self Care | Payer: BC Managed Care – PPO | Source: Home / Self Care

## 2019-11-04 DIAGNOSIS — B028 Zoster with other complications: Secondary | ICD-10-CM | POA: Diagnosis not present

## 2019-11-04 DIAGNOSIS — J340 Abscess, furuncle and carbuncle of nose: Secondary | ICD-10-CM

## 2019-11-04 MED ORDER — PREDNISONE 20 MG PO TABS: ORAL_TABLET | ORAL | 0 refills | Status: DC

## 2019-11-04 MED ORDER — VALACYCLOVIR HCL 1 G PO TABS: 1000.0000 mg | ORAL_TABLET | Freq: Three times a day (TID) | ORAL | 0 refills | Status: DC

## 2019-11-04 MED ORDER — HYPROMELLOSE (GONIOSCOPIC) 2.5 % OP SOLN: 1.0000 [drp] | Freq: Four times a day (QID) | OPHTHALMIC | 0 refills | Status: DC

## 2019-11-04 MED ORDER — AMOXICILLIN-POT CLAVULANATE 875-125 MG PO TABS: 1.0000 | ORAL_TABLET | Freq: Two times a day (BID) | ORAL | 0 refills | Status: DC

## 2019-11-04 NOTE — ED Provider Notes (Signed)
[facility]    CSN: [id number] Arrival date & time: 11/04/19  1752      History   Chief Complaint Chief Complaint  Patient presents with  . Eye Problem    HPI Danna Sewell is a 34 y.o. female.   HPI  Kinjal Neitzke is a 34 y.o. female presenting to UC with c/o gradually worsening red, bumpy itchy rash on her face that started Monday, 11/01/19.  Pt reports having watery swollen eyes on [place]. She initially thought her symptoms were due to her allergies but then she developed redness and pain in her Right eye only.  She has since noticed itchy bumps start on the bridge of her nose and now one is on her Right upper eyelid.  She also reports pain and swelling at the tip of her nose since Tuesday.  She has taken Benadryl w/o relief. Denies fever, chills, n/v/d. No change in vision.  Hx of chicken pox as a child.  She completed a prednisone dose pack for her back earlier in the week but no other new soaps, lotions or medications. No other rashes. Denies ear pain or itching. Denies oral swelling.  She has been under more stress recently due to busy schedule with family and school.  No recent illness.   She does have an eye specialist in [facility] for astigmatism. Last visit was in July.   Past Medical History:  Diagnosis Date  . Anxiety   . Fibromyalgia     Patient Active Problem List   Diagnosis Date Noted  . Acute sinusitis 07/17/2016  . Asthma with acute exacerbation 07/17/2016  . Moderate persistent asthma 06/26/2015  . Allergic rhinitis 06/26/2015  . 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 Concussion with < 1 hr loss of consciousness 05/30/2015  . 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 Fibromyalgia 12/01/2013    Past Surgical History:  Procedure Laterality Date  . KNEE ARTHROSCOPY     left knee 2012    OB History   No obstetric history on file.      Home Medications     Prior to Admission medications   Medication Sig Start Date End Date Taking? Authorizing Provider  albuterol (VENTOLIN HFA) 108 (90 Base) MCG/ACT inhaler Inhale 2 puffs into the lungs every 4 (four) hours as needed for wheezing or shortness of breath. 11/27/15   Mikki Santee, MD  ALPRAZolam Prudy Feeler) 1 MG tablet Take 1 mg by mouth 3 (three) times daily as needed for sleep.    [provider]  amoxicillin-clavulanate (AUGMENTIN) 875-125 MG tablet Take 1 tablet by mouth 2 (two) times daily. One po bid x 7 days 11/04/19   Lurene Shadow, PA-C  azelastine (ASTELIN) 0.1 % nasal spray Place 2 sprays into both nostrils 2 (two) times daily. Use in each nostril as directed 07/17/16   Bobbitt, Heywood Iles, MD  budesonide-formoterol [facility]) 160-4.5 MCG/ACT inhaler Inhale 2 puffs into the lungs 2 (two) times daily. 11/27/15   Mikki Santee, MD  fluticasone (FLONASE) 50 MCG/ACT nasal spray Place 2 sprays into both nostrils daily. 11/27/15   Mikki Santee, MD  HYDROcodone-acetaminophen (NORCO) 5-325 MG tablet Take 1 tablet by mouth every 6 (six) hours as needed for moderate pain. 09/09/19   Elvina Sidle, MD  HYDROMET 5-1.5 MG/5ML syrup  07/14/16   [provider]  hydroxypropyl methylcellulose / hypromellose (ISOPTO TEARS / GONIOVISC) 2.5 % ophthalmic solution Place 1 drop  into the right eye 4 (four) times daily. 11/04/19   Noe Gens, PA-C  levocetirizine (XYZAL) 5 MG tablet Take 5 mg by mouth every evening.    [provider]  levofloxacin (LEVAQUIN) 500 MG tablet  07/14/16   [provider]  loratadine (CLARITIN) 10 MG tablet Take 10 mg by mouth as needed for allergies.    [provider]  Milnacipran HCl (SAVELLA) 100 MG TABS tablet Take by mouth 2 (two) times daily.  05/04/15   [provider]  montelukast (SINGULAIR) 10 MG tablet Take 1 tablet (10 mg total) by mouth at bedtime. Patient not taking: Reported on 07/17/2016 11/27/15   Leda Roys, MD   predniSONE (DELTASONE) 20 MG tablet 3 tabs po day one, then 2 po daily x 4 days 11/04/19   Noe Gens, PA-C  pregabalin (LYRICA) 150 MG capsule Take 75 mg by mouth 2 (two) times daily.     [provider]  SUMAtriptan (IMITREX) 100 MG tablet SMARTSIG:1 Tablet(s) By Mouth Every 2 Hours PRN 10/14/19   [provider]  valACYclovir (VALTREX) 1000 MG tablet Take 1 tablet (1,000 mg total) by mouth 3 (three) times daily. 11/04/19   Noe Gens, PA-C    Family History Family History  Problem Relation Age of Onset  . Cancer Maternal Grandmother   . Allergic rhinitis Neg Hx   . Asthma Neg Hx   . Angioedema Neg Hx   . Eczema Neg Hx   . Urticaria Neg Hx   . Immunodeficiency Neg Hx     Social History Social History   Tobacco Use  . Smoking status: Never Smoker  . Smokeless tobacco: Never Used  Substance Use Topics  . Alcohol use: No  . Drug use: No     Allergies   Latex, Flexeril [cyclobenzaprine], Molds & smuts, Propoxyphene, Darvocet [propoxyphene n-acetaminophen], and Meloxicam   Review of Systems Review of Systems  Constitutional: Negative for chills and fever.  HENT:       Pain and swelling tip of nose  Eyes: Positive for pain, discharge (watery and crusting at times), redness and itching. Negative for photophobia and visual disturbance.  Skin: Positive for rash. Negative for wound.  Neurological: Negative for dizziness and headaches.     Physical Exam Triage Vital Signs ED Triage Vitals  Enc Vitals Group     BP 11/04/19 1810 (!) 140/108     Pulse Rate 11/04/19 1810 (!) 109     Resp 11/04/19 1810 16     Temp 11/04/19 1810 98 F (36.7 C)     Temp Source 11/04/19 1810 Oral     SpO2 11/04/19 1810 100 %     Weight 11/04/19 1814 232 lb (105.2 kg)     Height 11/04/19 1814 5\' 6"  (1.676 m)     Head Circumference --      Peak Flow --      Pain Score 11/04/19 1814 0     Pain Loc --      Pain Edu? --      Excl. in [place]? --    No data  found.  Updated Vital Signs BP (!) 140/108 (BP Location: Right Arm)   Pulse (!) 109   Temp 98 F (36.7 C) (Oral)   Resp 16   Ht 5\' 6"  (1.676 m)   Wt 232 lb (105.2 kg)   LMP  (LMP Unknown)   SpO2 100%   BMI 37.45 kg/m   Visual Acuity w/o correction  Right Eye Distance: 20/40(astigmatism) Left Eye Distance: 20/20 Bilateral Distance: 20/25  Right Eye Near:   Left Eye Near:    Bilateral Near:     Physical Exam Vitals and nursing note reviewed.  Constitutional:      Appearance: Normal appearance. She is well-developed.  HENT:     Head: Normocephalic and atraumatic.      Comments: 3 erythematous vesicles on bridge of nose to the Right side,1 erythematous vesicle on Right upper eyelid. Non-tender.     Right Ear: Tympanic membrane and ear canal normal.     Left Ear: Tympanic membrane and ear canal normal.     Nose: Nasal tenderness (swelling and redness at tip) present.      Mouth/Throat:     Lips: Pink.     Mouth: Mucous membranes are moist.     Pharynx: Oropharynx is clear. Uvula midline.  Eyes:     Extraocular Movements: Extraocular movements intact.     Conjunctiva/sclera: Conjunctivae normal.     Pupils: Pupils are equal, round, and reactive to light.     Comments: No fluorescein uptake   Cardiovascular:     Rate and Rhythm: Normal rate.  Pulmonary:     Effort: Pulmonary effort is normal.  Musculoskeletal:        General: Normal range of motion.     Cervical back: Normal range of motion and neck supple.  Lymphadenopathy:     Cervical: No cervical adenopathy.  Skin:    General: Skin is warm and dry.  Neurological:     Mental Status: She is alert and oriented to person, place, and time.  Psychiatric:        Behavior: Behavior normal.      UC Treatments / Results  Labs (all labs ordered are listed, but only abnormal results are displayed) Labs Reviewed - No data to display  EKG   Radiology No results found.  Procedures Procedures (including  critical care time)  Medications Ordered in UC Medications - No data to display  Initial Impression / Assessment and Plan / UC Course  I have reviewed the triage vital signs and the nursing notes.  Pertinent labs & imaging results that were available during my care of the patient were reviewed by me and considered in my medical decision making (see chart for details).     Hx and exam c/w for herpes zoster. No evidence of corneal involvement at this time, however, will start antiviral treatment tonight along with antibiotic for suspected secondary bacterial infection on tip of nose. Prednisone prescribed, however, due to recent completion of taper, pt to hold until f/u with ophthalmologist tomorrow.  Stressed importance of close f/u  Pt verbalized understanding and agreement with tx plan.  Final Clinical Impressions(s) / UC Diagnoses   Final diagnoses:  Herpes zoster with complication  Cellulitis of external nose     Discharge Instructions      You have been prescribed an antiviral medication, valtrex, to treat suspected herpes zoster (shingles) on your face.   You have also been prescribed an antibiotic, Augmentin, to treat secondary bacterial infection on your nose. Prednisone has been prescribed to help limit duration of your shingles episode.  Because shingles is around your eye, it is VERY important to follow up with an eye specialist tomorrow. If the rash gets into your eye it can permanently affect your vision and lead to blindness if not treated promptly and properly.   Please don't hesitate to call our office tomorrow if  your eye specialist needs records for a referral.      ED Prescriptions    Medication Sig Dispense Auth. Provider   amoxicillin-clavulanate (AUGMENTIN) 875-125 MG tablet Take 1 tablet by mouth 2 (two) times daily. One po bid x 7 days 14 tablet Kayla Deshaies O, PA-C   valACYclovir (VALTREX) 1000 MG tablet Take 1 tablet (1,000 mg total) by mouth 3  (three) times daily. 21 tablet Doroteo Glassman, Lelend Heinecke O, PA-C   predniSONE (DELTASONE) 20 MG tablet 3 tabs po day one, then 2 po daily x 4 days 11 tablet Cordney Barstow O, PA-C   hydroxypropyl methylcellulose / hypromellose (ISOPTO TEARS / GONIOVISC) 2.5 % ophthalmic solution Place 1 drop into the right eye 4 (four) times daily. 15 mL Lurene Shadow, PA-C     PDMP not reviewed this encounter.   Lurene Shadow, [place] 11/04/19 1925

## 2019-11-04 NOTE — Discharge Instructions (Signed)
  You have been prescribed an antiviral medication, valtrex, to treat suspected herpes zoster (shingles) on your face.   You have also been prescribed an antibiotic, Augmentin, to treat secondary bacterial infection on your nose. Prednisone has been prescribed to help limit duration of your shingles episode.  Because shingles is around your eye, it is VERY important to follow up with an eye specialist tomorrow. If the rash gets into your eye it can permanently affect your vision and lead to blindness if not treated promptly and properly.   Please don't hesitate to call our office tomorrow if your eye specialist needs records for a referral.

## 2019-11-04 NOTE — ED Triage Notes (Signed)
Started Sunday, both eyes were swollen [place].  Woke up next day with eye crusted over. Using pink eye drops. Bumps started appearing Monday, multiplying daily. Very itchy and swollen. Rt eye watering constantly. Taking benedryl daily since.

## 2020-05-02 ENCOUNTER — Emergency Department: Admit: 2020-05-02 | Discharge status: Absent without leave | Payer: Self-pay

## 2020-05-02 ENCOUNTER — Other Ambulatory Visit: Payer: Self-pay

## 2020-05-02 ENCOUNTER — Emergency Department
Admission: EM | Admit: 2020-05-02 | Discharge: 2020-05-02 | Disposition: A | Discharge status: Home / Self Care | Payer: BC Managed Care – PPO | Source: Home / Self Care | Attending: Family Medicine | Admitting: Family Medicine

## 2020-05-02 DIAGNOSIS — M7061 Trochanteric bursitis, right hip: Secondary | ICD-10-CM

## 2020-05-02 MED ORDER — HYDROCODONE-ACETAMINOPHEN 5-325 MG PO TABS: ORAL_TABLET | ORAL | 0 refills | Status: DC

## 2020-05-02 MED ORDER — PREDNISONE 20 MG PO TABS: ORAL_TABLET | ORAL | 0 refills | Status: DC

## 2020-05-02 NOTE — Discharge Instructions (Addendum)
Apply ice pack for 20 to 30 minutes, 3 to 4 times daily  Continue until pain and swelling decrease.  Begin range of motion and stretching exercises as tolerated. 

## 2020-05-02 NOTE — ED Triage Notes (Signed)
Day before yesterday, went to sit down and started having stabbing pain in RT buttock. Radiates to hip and down back of leg. Has been in a cam walker since 8/23  for unrelated problem. Hx of sciatic, sacral injections.

## 2020-05-02 NOTE — ED Provider Notes (Signed)
[facility]    CSN: [id number] Arrival date & time: 05/02/20  2637      History   Chief Complaint Chief Complaint  Patient presents with  . Hip Pain    RT    HPI Jo Singh is a 34 y.o. female.   Two days ago while starting to sit down, patient had a sudden stabbing pain in her right hip area.  The pain is worse with movement of her hip and radiates down the lateral aspect of her right thigh.  She has a past history of left posterior malleolar and distal fibular fracture, and was placed in a left cam walker on 04/17/20 for left peroneal tendonitis.  She also has a past history of sciatic pain, but notes that her present pain is completely different.  The pain is exacerbated when she lies on her right hip.  The history is provided by the patient.  Hip Pain This is a new problem. The current episode started 2 days ago. The problem occurs constantly. The problem has not changed since onset.Associated symptoms comments: No back pain. The symptoms are aggravated by walking. Nothing relieves the symptoms. She has tried nothing for the symptoms.    Past Medical History:  Diagnosis Date  . Anxiety   . Fibromyalgia     Patient Active Problem List   Diagnosis Date Noted  . Acute sinusitis 07/17/2016  . Asthma with acute exacerbation 07/17/2016  . Moderate persistent asthma 06/26/2015  . Allergic rhinitis 06/26/2015  . 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 Concussion with < 1 hr loss of consciousness 05/30/2015  . 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 Fibromyalgia 12/01/2013    Past Surgical History:  Procedure Laterality Date  . KNEE ARTHROSCOPY     left knee 2012    OB History   No obstetric history on file.      Home Medications    Prior to Admission medications   Medication Sig Start Date End Date Taking? Authorizing Provider  albuterol (VENTOLIN HFA) 108 (90 Base) MCG/ACT inhaler Inhale 2 puffs  into the lungs every 4 (four) hours as needed for wheezing or shortness of breath. 11/27/15   Mikki Santee, MD  ALPRAZolam Prudy Feeler) 1 MG tablet Take 1 mg by mouth 3 (three) times daily as needed for sleep.    [provider]  amoxicillin-clavulanate (AUGMENTIN) 875-125 MG tablet Take 1 tablet by mouth 2 (two) times daily. One po bid x 7 days 11/04/19   Lurene Shadow, PA-C  azelastine (ASTELIN) 0.1 % nasal spray Place 2 sprays into both nostrils 2 (two) times daily. Use in each nostril as directed 07/17/16   Bobbitt, Heywood Iles, MD  budesonide-formoterol [facility]) 160-4.5 MCG/ACT inhaler Inhale 2 puffs into the lungs 2 (two) times daily. 11/27/15   Mikki Santee, MD  fluticasone (FLONASE) 50 MCG/ACT nasal spray Place 2 sprays into both nostrils daily. 11/27/15   Mikki Santee, MD  HYDROcodone-acetaminophen [facility]) 5-325 MG tablet Take one tab PO HS prn pain 05/02/20   Lattie Haw, MD  HYDROMET 5-1.5 MG/5ML syrup  07/14/16   [provider]  hydroxypropyl methylcellulose / hypromellose (ISOPTO TEARS / GONIOVISC) 2.5 % ophthalmic solution Place 1 drop into the right eye 4 (four) times daily. 11/04/19   Lurene Shadow, PA-C  levocetirizine (XYZAL) 5 MG tablet Take 5 mg by mouth every evening.    [provider]  levofloxacin (LEVAQUIN) 500 MG tablet  07/14/16  [provider]  loratadine (CLARITIN) 10 MG tablet Take 10 mg by mouth as needed for allergies.    [provider]  Milnacipran HCl (SAVELLA) 100 MG TABS tablet Take 50 mg by mouth 2 (two) times daily.  05/04/15   [provider]  montelukast (SINGULAIR) 10 MG tablet Take 1 tablet (10 mg total) by mouth at bedtime. Patient not taking: Reported on 07/17/2016 11/27/15   Mikki Santee, MD  predniSONE (DELTASONE) 20 MG tablet Take one tab by mouth twice daily for 4 days, then one daily for 3 days. Take with food. 05/02/20   Lattie Haw, MD  pregabalin (LYRICA) 150 MG capsule Take 75 mg by mouth 2  (two) times daily.     [provider]  SUMAtriptan (IMITREX) 100 MG tablet SMARTSIG:1 Tablet(s) By Mouth Every 2 Hours PRN 10/14/19   [provider]  tizanidine (ZANAFLEX) 2 MG capsule TAKE 1 CAPSULE (2 MG TOTAL) BY MOUTH EVERY 8 HOURS AS NEEDED FOR MUSCLE SPASMS. 04/21/20   [provider]  valACYclovir (VALTREX) 1000 MG tablet Take 1 tablet (1,000 mg total) by mouth 3 (three) times daily. 11/04/19   Lurene Shadow, PA-C    Family History Family History  Problem Relation Age of Onset  . Cancer Maternal Grandmother   . Allergic rhinitis Neg Hx   . Asthma Neg Hx   . Angioedema Neg Hx   . Eczema Neg Hx   . Urticaria Neg Hx   . Immunodeficiency Neg Hx     Social History Social History   Tobacco Use  . Smoking status: Never Smoker  . Smokeless tobacco: Never Used  Vaping Use  . Vaping Use: Never used  Substance Use Topics  . Alcohol use: No  . Drug use: No     Allergies   Latex, Flexeril [cyclobenzaprine], Molds & smuts, Propoxyphene, Darvocet [propoxyphene n-acetaminophen], and Meloxicam   Review of Systems Review of Systems  Constitutional: Negative.   HENT: Negative.   Eyes: Negative.   Respiratory: Negative.   Cardiovascular: Negative.   Gastrointestinal: Negative.   Genitourinary: Negative.   Musculoskeletal:       Right hip pain  Skin: Negative.   Neurological: Negative.      Physical Exam Triage Vital Signs ED Triage Vitals  Enc Vitals Group     BP 05/02/20 1011 (!) 146/106     Pulse Rate 05/02/20 1011 (!) 114     Resp 05/02/20 1011 18     Temp 05/02/20 1011 98.1 F (36.7 C)     Temp Source 05/02/20 1011 Oral     SpO2 05/02/20 1011 100 %     Weight --      Height --      Head Circumference --      Peak Flow --      Pain Score 05/02/20 1017 8     Pain Loc --      Pain Edu? --      Excl. in [place]? --    No data found.  Updated Vital Signs BP (!) 146/106 (BP Location: Right Arm)   Pulse (!) 114   Temp 98.1 F (36.7  C) (Oral)   Resp 18   LMP  (LMP Unknown)   SpO2 100%   Visual Acuity Right Eye Distance:   Left Eye Distance:   Bilateral Distance:    Right Eye Near:   Left Eye Near:    Bilateral Near:     Physical Exam Vitals and  nursing note reviewed.  Constitutional:      General: She is not in acute distress.    Appearance: She is obese.  HENT:     Head: Normocephalic.     Mouth/Throat:     Pharynx: Oropharynx is clear.  Eyes:     Pupils: Pupils are equal, round, and reactive to light.  Cardiovascular:     Rate and Rhythm: Tachycardia present.  Pulmonary:     Effort: Pulmonary effort is normal.  Abdominal:     Palpations: Abdomen is soft.     Tenderness: There is no abdominal tenderness.  Musculoskeletal:     Cervical back: Normal range of motion.     Right hip: Tenderness present. No deformity. Decreased range of motion.       Legs:     Comments: Right hip reveals distinct tenderness over the greater trochanter.  Palpating the greater trochanter during resisted lateral abduction of the hip recreates her pain.   Skin:    General: Skin is warm and dry.     Findings: No rash.  Neurological:     General: No focal deficit present.     Mental Status: She is alert.      UC Treatments / Results  Labs (all labs ordered are listed, but only abnormal results are displayed) Labs Reviewed - No data to display  EKG   Radiology No results found.  Procedures Procedures (including critical care time)  Medications Ordered in UC Medications - No data to display  Initial Impression / Assessment and Plan / UC Course  I have reviewed the triage vital signs and the nursing notes.  Pertinent labs & imaging results that were available during my care of the patient were reviewed by me and considered in my medical decision making (see chart for details).    Bursitis on right most likely a result of wearing cam walker on left foot. Begin prednisone burst/taper.  Rx for Lortab at  bedtime (#5, no refill). Controlled Substance Prescriptions I have consulted the [place] Controlled Substances Registry for this patient, and feel the risk/benefit ratio today is favorable for proceeding with this prescription for a controlled substance.   Followup with Dr. [facility] ([facility] as soon as possible for management.   Final Clinical Impressions(s) / UC Diagnoses   Final diagnoses:  Trochanteric bursitis of right hip     Discharge Instructions     Apply ice pack for 20 to 30 minutes, 3 to 4 times daily  Continue until pain and swelling decrease.  Begin range of motion and stretching exercises as tolerated.    ED Prescriptions    Medication Sig Dispense Auth. Provider   HYDROcodone-acetaminophen (NORCO) 5-325 MG tablet Take one tab PO HS prn pain 5 tablet Lattie Haw, MD   predniSONE (DELTASONE) 20 MG tablet Take one tab by mouth twice daily for 4 days, then one daily for 3 days. Take with food. 11 tablet Lattie Haw, MD        Lattie Haw, MD 05/06/20 [phone]

## 2020-05-16 ENCOUNTER — Encounter: Payer: BC Managed Care – PPO | Admitting: Sports Medicine

## 2020-12-06 ENCOUNTER — Emergency Department
Admission: EM | Admit: 2020-12-06 | Discharge: 2020-12-06 | Disposition: A | Discharge status: Home / Self Care | Payer: BC Managed Care – PPO | Source: Home / Self Care | Attending: Family Medicine | Admitting: Family Medicine

## 2020-12-06 ENCOUNTER — Other Ambulatory Visit: Payer: Self-pay

## 2020-12-06 DIAGNOSIS — H6983 Other specified disorders of Eustachian tube, bilateral: Secondary | ICD-10-CM

## 2020-12-06 MED ORDER — FLUTICASONE PROPIONATE 50 MCG/ACT NA SUSP: 2.0000 | Freq: Every day | NASAL | 0 refills | Status: AC

## 2020-12-06 MED ORDER — PREDNISONE 50 MG PO TABS: ORAL_TABLET | ORAL | 0 refills | Status: AC

## 2020-12-06 NOTE — ED Triage Notes (Signed)
Patient presents to Urgent Care with complaints of bilateral ear pain since 2 weeks. Patient reports left ear worst than left ear, pain with applying pressure and having mask ear loops around ears irritates. Did start antihistamine.

## 2020-12-06 NOTE — ED Provider Notes (Signed)
[facility]    CSN: [id number] Arrival date & time: 12/06/20  1739      History   Chief Complaint Chief Complaint  Patient presents with  . Otalgia    Bilateral    HPI Jo Singh is a 35 y.o. female.   HPI   Patient has been fighting allergies at home.  She is taking Xyzal.  She is using a saline rinse.  She states that she is taking some Singulair.  She is not using any Flonase or Nasacort, nasal steroids.  She has been having sinus congestion, postnasal drip, sore throat, ear pressure and pain for couple of weeks.  She states her sinus pressure is getting better but the ears continue to hurt.  Hearing is normal.  She works as a [employer] loss adjuster  Patient states that she has fibromyalgia.  Migraine headaches.  She takes Savella with good results.  Also Lyrica daily.  Uses Imitrex infrequently.  Past Medical History:  Diagnosis Date  . Anxiety   . Fibromyalgia     Patient Active Problem List   Diagnosis Date Noted  . Acute sinusitis 07/17/2016  . Asthma with acute exacerbation 07/17/2016  . Moderate persistent asthma 06/26/2015  . Allergic rhinitis 06/26/2015  . 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 Concussion with < 1 hr loss of consciousness 05/30/2015  . 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 Fibromyalgia 12/01/2013    Past Surgical History:  Procedure Laterality Date  . KNEE ARTHROSCOPY     left knee 2012    OB History   No obstetric history on file.      Home Medications    Prior to Admission medications   Medication Sig Start Date End Date Taking? Authorizing Provider  ALPRAZolam Prudy Feeler) 1 MG tablet Take 1 mg by mouth 3 (three) times daily as needed for sleep.   Yes [provider]  fluticasone (FLONASE) 50 MCG/ACT nasal spray Place 2 sprays into both nostrils daily. 12/06/20  Yes Eustace Moore, MD  levocetirizine (XYZAL) 5 MG tablet Take 5 mg by mouth every evening.   Yes  [provider]  Milnacipran HCl (SAVELLA) 100 MG TABS tablet Take 50 mg by mouth 2 (two) times daily.  05/04/15  Yes [provider]  predniSONE (DELTASONE) 50 MG tablet Take once a day in the morning 12/06/20  Yes Eustace Moore, MD  pregabalin (LYRICA) 150 MG capsule Take 75 mg by mouth 2 (two) times daily.    Yes [provider]  albuterol (VENTOLIN HFA) 108 (90 Base) MCG/ACT inhaler Inhale 2 puffs into the lungs every 4 (four) hours as needed for wheezing or shortness of breath. 11/27/15   Mikki Santee, MD  budesonide-formoterol (SYMBICORT) 160-4.5 MCG/ACT inhaler Inhale 2 puffs into the lungs 2 (two) times daily. 11/27/15   Mikki Santee, MD  SUMAtriptan (IMITREX) 100 MG tablet SMARTSIG:1 Tablet(s) By Mouth Every 2 Hours PRN 10/14/19   [provider]  tizanidine (ZANAFLEX) 2 MG capsule TAKE 1 CAPSULE (2 MG TOTAL) BY MOUTH EVERY 8 HOURS AS NEEDED FOR MUSCLE SPASMS. 04/21/20   [provider]  azelastine (ASTELIN) 0.1 % nasal spray Place 2 sprays into both nostrils 2 (two) times daily. Use in each nostril as directed 07/17/16 12/06/20  Bobbitt, Heywood Iles, MD  loratadine (CLARITIN) 10 MG tablet Take 10 mg by mouth as needed for allergies.  12/06/20  [provider]  montelukast (SINGULAIR) 10 MG tablet Take 1 tablet (10 mg  total) by mouth at bedtime. Patient not taking: No sig reported 11/27/15 12/06/20  Mikki Santee, MD    Family History Family History  Problem Relation Age of Onset  . Cancer Maternal Grandmother   . Allergic rhinitis Neg Hx   . Asthma Neg Hx   . Angioedema Neg Hx   . Eczema Neg Hx   . Urticaria Neg Hx   . Immunodeficiency Neg Hx     Social History Social History   Tobacco Use  . Smoking status: Never Smoker  . Smokeless tobacco: Never Used  Vaping Use  . Vaping Use: Never used  Substance Use Topics  . Alcohol use: No  . Drug use: No     Allergies   Latex, Flexeril [cyclobenzaprine], Molds & smuts,  Propoxyphene, Darvocet [propoxyphene n-acetaminophen], and Meloxicam   Review of Systems Review of Systems See HPI  Physical Exam Triage Vital Signs ED Triage Vitals  Enc Vitals Group     BP 12/06/20 1755 (!) 138/97     Pulse Rate 12/06/20 1755 (!) 102     Resp 12/06/20 1755 16     Temp 12/06/20 1755 99.2 F (37.3 C)     Temp Source 12/06/20 1755 Oral     SpO2 12/06/20 1755 100 %     Weight --      Height --      Head Circumference --      Peak Flow --      Pain Score 12/06/20 1752 6     Pain Loc --      Pain Edu? --      Excl. in GC? --    No data found.  Updated Vital Signs BP (!) 138/97 (BP Location: Right Arm)   Pulse (!) 102   Temp 99.2 F (37.3 C) (Oral)   Resp 16   SpO2 100%     Physical Exam Constitutional:      General: She is not in acute distress.    Appearance: Normal appearance. She is well-developed.  HENT:     Head: Normocephalic and atraumatic.     Right Ear: Tympanic membrane and ear canal normal.     Left Ear: Tympanic membrane and ear canal normal.     Nose: No congestion or rhinorrhea.     Mouth/Throat:     Pharynx: No posterior oropharyngeal erythema.  Eyes:     Conjunctiva/sclera: Conjunctivae normal.     Pupils: Pupils are equal, round, and reactive to light.  Cardiovascular:     Rate and Rhythm: Normal rate and regular rhythm.  Pulmonary:     Effort: Pulmonary effort is normal. No respiratory distress.  Abdominal:     Palpations: Abdomen is soft.  Musculoskeletal:        General: Normal range of motion.     Cervical back: Normal range of motion.  Lymphadenopathy:     Cervical: Cervical adenopathy present.  Skin:    General: Skin is warm and dry.  Neurological:     Mental Status: She is alert.     Gait: Gait abnormal.     Comments: CAM Walker on left foot  Psychiatric:        Behavior: Behavior normal.      UC Treatments / Results  Labs (all labs ordered are listed, but only abnormal results are displayed) Labs  Reviewed - No data to display  EKG   Radiology No results found.  Procedures Procedures (including critical care time)  Medications Ordered in UC Medications -  No data to display  Initial Impression / Assessment and Plan / UC Course  I have reviewed the triage vital signs and the nursing notes.  Pertinent labs & imaging results that were available during my care of the patient were reviewed by me and considered in my medical decision making (see chart for details).     Referred ear pain versus eustachian tube dysfunction.  Will add Flonase.  Few days of prednisone to help with allergy symptoms.  Drink plenty of fluids. Final Clinical Impressions(s) / UC Diagnoses   Final diagnoses:  Eustachian tube dysfunction, bilateral     Discharge Instructions     Drink plenty of water Take prednisone once a day in the morning.  Take with food Use the Flonase twice a day for the first couple of days, then reduce to once a day through the allergy season     ED Prescriptions    Medication Sig Dispense Auth. Provider   predniSONE (DELTASONE) 50 MG tablet Take once a day in the morning 5 tablet Eustace Moore, MD   fluticasone [facility]) 50 MCG/ACT nasal spray Place 2 sprays into both nostrils daily. 16 g Eustace Moore, MD     PDMP not reviewed this encounter.   Eustace Moore, MD 12/06/20 [phone]

## 2020-12-06 NOTE — Discharge Instructions (Addendum)
Drink plenty of water Take prednisone once a day in the morning.  Take with food Use the Flonase twice a day for the first couple of days, then reduce to once a day through the allergy season
